# Patient Record
Sex: Male | Born: 1985 | Race: Black or African American | Hispanic: No | Marital: Single | Smoking: Former smoker
Health system: Southern US, Community
[De-identification: ages and names within clinical notes are randomized; demographics above are authoritative.]

## PROBLEM LIST (undated history)

## (undated) DIAGNOSIS — F1020 Alcohol dependence, uncomplicated: Secondary | ICD-10-CM

## (undated) DIAGNOSIS — N200 Calculus of kidney: Secondary | ICD-10-CM

## (undated) HISTORY — DX: Alcohol dependence, uncomplicated: F10.20

## (undated) HISTORY — DX: Calculus of kidney: N20.0

## (undated) HISTORY — PX: DENTAL SURGERY: SHX609

---

## 2014-12-06 ENCOUNTER — Ambulatory Visit: Payer: Self-pay | Admitting: Family

## 2014-12-09 ENCOUNTER — Ambulatory Visit (INDEPENDENT_AMBULATORY_CARE_PROVIDER_SITE_OTHER): Payer: Managed Care, Other (non HMO) | Admitting: Family

## 2014-12-09 ENCOUNTER — Encounter: Payer: Self-pay | Admitting: Family

## 2014-12-09 VITALS — BP 138/88 | HR 76 | Temp 98.4°F | Resp 18 | Ht 67.0 in | Wt 172.4 lb

## 2014-12-09 DIAGNOSIS — M545 Low back pain, unspecified: Secondary | ICD-10-CM | POA: Insufficient documentation

## 2014-12-09 NOTE — Progress Notes (Signed)
Subjective:    Patient ID: Gary Montgomery, male    DOB: [birth year], 29 y.o.   MRN: [id number]  Chief Complaint  Patient presents with  . Establish Care    having back pain, usually gives him a problem when he does something strenuous, has had his back lock up a few times, also has had issues with have shooting pains on his side, happens randomly    HPI:  Gary Founddrian Zamudio is a 29 y.o. male who presents today for an office visit to establish care.  1.) Back pain - Associated symptom of pain located in his lower back has been going on for a couple of years with episodes of his back "locking up" at periods of time. Describes the pain as soreness with timing of symptoms being worse in the morning. Severity of the pain is rated around a 5/10. Modifying factors include walking around and taking a shower which helps the pain go away. Formerly did heavy lifting at work which has since been decreased. No change to bowel or bladder habits or saddle anesthesia.  2) Shooting pains - Associated symptom of pain located in his side that is described as shooting pains that occur infrequently. The last time that he had the symptoms was several months ago. Time was the only modifying factor that made the pain better.    No Known Allergies   No current outpatient prescriptions on file prior to visit.   No current facility-administered medications on file prior to visit.    History reviewed. No pertinent past medical history.  History reviewed. No pertinent past surgical history.  Family History  Problem Relation Age of Onset  . Hypertension Mother   . Hypertension Father   . Arthritis Maternal Grandmother   . Hypertension Maternal Grandmother   . Hypertension Maternal Grandfather   . Hypertension Paternal Grandmother   . Hypertension Paternal Grandfather     History   Social History  . Marital Status: Single    Spouse Name: N/A  . Number of Children: N/A  . Years of Education: N/A    Occupational History  . Not on file.   Social History Main Topics  . Smoking status: Former Smoker -- 0.50 packs/day for 9 years    Quit date: 12/08/2009  . Smokeless tobacco: Never Used  . Alcohol Use: 12.6 oz/week    0 Standard drinks or equivalent, 21 Cans of beer per week  . Drug Use: No  . Sexual Activity: Not on file   Other Topics Concern  . Not on file   Social History Narrative    Review of Systems  Constitutional: Negative for fever and chills.  Musculoskeletal: Positive for back pain.  Neurological: Negative for numbness.      Objective:    BP 138/88 mmHg  Pulse 76  Temp(Src) 98.4 F (36.9 C) (Oral)  Resp 18  Ht 5\' 7"  (1.702 m)  Wt 172 lb 6.4 oz (78.2 kg)  BMI 27.00 kg/m2  SpO2 97% Nursing note and vital signs reviewed.  Physical Exam  Constitutional: He is oriented to person, place, and time. He appears well-developed and well-nourished. No distress.  Cardiovascular: Normal rate, regular rhythm, normal heart sounds and intact distal pulses.   Pulmonary/Chest: Effort normal and breath sounds normal.  Musculoskeletal:  No obvious deformity, discoloration, or edema of low back noted. Palpable tenderness along the left lumbar paraspinal musculature. Full range of motion is present in all directions. Distal pulses, sensation, and reflexes are intact and appropriate.  There is tight hamstring and hip flexor muscle groups.  Neurological: He is alert and oriented to person, place, and time.  Skin: Skin is warm and dry.  Psychiatric: He has a normal mood and affect. His behavior is normal. Judgment and thought content normal.       Assessment & Plan:

## 2014-12-09 NOTE — Assessment & Plan Note (Signed)
Symptoms and exam consistent with low back muscle pain secondary to muscle tightness. Continue conservative treatment with heat and stretching at this time. Provided with exercises to perform at home and stretching. Over-the-counter anti-inflammatories as needed for pain. Follow-up if symptoms worsen or fail to improve.  No evidence of shooting pains currently and exam is benign. Continue to monitor.

## 2014-12-09 NOTE — Patient Instructions (Addendum)
Thank you for choosing [facility].  Summary/Instructions:  Your prescription(s) have been submitted to your pharmacy or been printed and provided for you. Please take as directed and contact our office if you believe you are having problem(s) with the medication(s) or have any questions.  If your symptoms worsen or fail to improve, please contact our office for further instruction, or in case of emergency go directly to the emergency room at the closest medical facility.   EXERCISES  RANGE OF MOTION (ROM) AND STRETCHING EXERCISES - Low Back Strain Most people with lower back pain will find that their symptoms get worse with excessive bending forward (flexion) or arching at the lower back (extension). The exercises which will help resolve your symptoms will focus on the opposite motion.  Your physician, physical therapist or athletic trainer will help you determine which exercises will be most helpful to resolve your lower back pain. Do not complete any exercises without first consulting with your caregiver. Discontinue any exercises which make your symptoms worse until you speak to your caregiver.  If you have pain, numbness or tingling which travels down into your buttocks, leg or foot, the goal of the therapy is for these symptoms to move closer to your back and eventually resolve. Sometimes, these leg symptoms will get better, but your lower back pain may worsen. This is typically an indication of progress in your rehabilitation. Be very alert to any changes in your symptoms and the activities in which you participated in the 24 hours prior to the change. Sharing this information with your caregiver will allow him/her to most efficiently treat your condition.  These exercises may help you when beginning to rehabilitate your injury. Your symptoms may resolve with or without further involvement from your physician, physical therapist or athletic trainer. While completing these exercises,  remember:  Restoring tissue flexibility helps normal motion to return to the joints. This allows healthier, less painful movement and activity.  An effective stretch should be held for at least 30 seconds.  A stretch should never be painful. You should only feel a gentle lengthening or release in the stretched tissue. FLEXION RANGE OF MOTION AND STRETCHING EXERCISES: STRETCH - Flexion, Single Knee to Chest   Lie on a firm bed or floor with both legs extended in front of you.  Keeping one leg in contact with the floor, bring your opposite knee to your chest. Hold your leg in place by either grabbing behind your thigh or at your knee.  Pull until you feel a gentle stretch in your lower back. Hold __________ seconds.  Slowly release your grasp and repeat the exercise with the opposite side. Repeat __________ times. Complete this exercise __________ times per day.  STRETCH - Flexion, Double Knee to Chest   Lie on a firm bed or floor with both legs extended in front of you.  Keeping one leg in contact with the floor, bring your opposite knee to your chest.  Tense your stomach muscles to support your back and then lift your other knee to your chest. Hold your legs in place by either grabbing behind your thighs or at your knees.  Pull both knees toward your chest until you feel a gentle stretch in your lower back. Hold __________ seconds.  Tense your stomach muscles and slowly return one leg at a time to the floor. Repeat __________ times. Complete this exercise __________ times per day.  STRETCH - Low Trunk Rotation  Lie on a firm bed or floor.  Keeping your legs in front of you, bend your knees so they are both pointed toward the ceiling and your feet are flat on the floor.  Extend your arms out to the side. This will stabilize your upper body by keeping your shoulders in contact with the floor.  Gently and slowly drop both knees together to one side until you feel a gentle stretch in  your lower back. Hold for __________ seconds.  Tense your stomach muscles to support your lower back as you bring your knees back to the starting position. Repeat the exercise to the other side. Repeat __________ times. Complete this exercise __________ times per day  EXTENSION RANGE OF MOTION AND FLEXIBILITY EXERCISES: STRETCH - Extension, Prone on Elbows   Lie on your stomach on the floor, a bed will be too soft. Place your palms about shoulder width apart and at the height of your head.  Place your elbows under your shoulders. If this is too painful, stack pillows under your chest.  Allow your body to relax so that your hips drop lower and make contact more completely with the floor.  Hold this position for __________ seconds.  Slowly return to lying flat on the floor. Repeat __________ times. Complete this exercise __________ times per day.  RANGE OF MOTION - Extension, Prone Press Ups  Lie on your stomach on the floor, a bed will be too soft. Place your palms about shoulder width apart and at the height of your head.  Keeping your back as relaxed as possible, slowly straighten your elbows while keeping your hips on the floor. You may adjust the placement of your hands to maximize your comfort. As you gain motion, your hands will come more underneath your shoulders.  Hold this position __________ seconds.  Slowly return to lying flat on the floor. Repeat __________ times. Complete this exercise __________ times per day.  RANGE OF MOTION- Quadruped, Neutral Spine   Assume a hands and knees position on a firm surface. Keep your hands under your shoulders and your knees under your hips. You may place padding under your knees for comfort.  Drop your head and point your tail bone toward the ground below you. This will round out your lower back like an angry cat. Hold this position for __________ seconds.  Slowly lift your head and release your tail bone so that your back sags into a  large arch, like an old horse.  Hold this position for __________ seconds.  Repeat this until you feel limber in your lower back.  Now, find your "sweet spot." This will be the most comfortable position somewhere between the two previous positions. This is your neutral spine. Once you have found this position, tense your stomach muscles to support your lower back.  Hold this position for __________ seconds. Repeat __________ times. Complete this exercise __________ times per day.  STRENGTHENING EXERCISES - Low Back Strain These exercises may help you when beginning to rehabilitate your injury. These exercises should be done near your "sweet spot." This is the neutral, low-back arch, somewhere between fully rounded and fully arched, that is your least painful position. When performed in this safe range of motion, these exercises can be used for people who have either a flexion or extension based injury. These exercises may resolve your symptoms with or without further involvement from your physician, physical therapist or athletic trainer. While completing these exercises, remember:   Muscles can gain both the endurance and the strength needed for everyday  activities through controlled exercises.  Complete these exercises as instructed by your physician, physical therapist or athletic trainer. Increase the resistance and repetitions only as guided.  You may experience muscle soreness or fatigue, but the pain or discomfort you are trying to eliminate should never worsen during these exercises. If this pain does worsen, stop and make certain you are following the directions exactly. If the pain is still present after adjustments, discontinue the exercise until you can discuss the trouble with your caregiver. STRENGTHENING - Deep Abdominals, Pelvic Tilt  Lie on a firm bed or floor. Keeping your legs in front of you, bend your knees so they are both pointed toward the ceiling and your feet are flat on  the floor.  Tense your lower abdominal muscles to press your lower back into the floor. This motion will rotate your pelvis so that your tail bone is scooping upwards rather than pointing at your feet or into the floor.  With a gentle tension and even breathing, hold this position for __________ seconds. Repeat __________ times. Complete this exercise __________ times per day.  STRENGTHENING - Abdominals, Crunches   Lie on a firm bed or floor. Keeping your legs in front of you, bend your knees so they are both pointed toward the ceiling and your feet are flat on the floor. Cross your arms over your chest.  Slightly tip your chin down without bending your neck.  Tense your abdominals and slowly lift your trunk high enough to just clear your shoulder blades. Lifting higher can put excessive stress on the lower back and does not further strengthen your abdominal muscles.  Control your return to the starting position. Repeat __________ times. Complete this exercise __________ times per day.  STRENGTHENING - Quadruped, Opposite UE/LE Lift   Assume a hands and knees position on a firm surface. Keep your hands under your shoulders and your knees under your hips. You may place padding under your knees for comfort.  Find your neutral spine and gently tense your abdominal muscles so that you can maintain this position. Your shoulders and hips should form a rectangle that is parallel with the floor and is not twisted.  Keeping your trunk steady, lift your right hand no higher than your shoulder and then your left leg no higher than your hip. Make sure you are not holding your breath. Hold this position __________ seconds.  Continuing to keep your abdominal muscles tense and your back steady, slowly return to your starting position. Repeat with the opposite arm and leg. Repeat __________ times. Complete this exercise __________ times per day.  STRENGTHENING - Lower Abdominals, Double Knee Lift  Lie  on a firm bed or floor. Keeping your legs in front of you, bend your knees so they are both pointed toward the ceiling and your feet are flat on the floor.  Tense your abdominal muscles to brace your lower back and slowly lift both of your knees until they come over your hips. Be certain not to hold your breath.  Hold __________ seconds. Using your abdominal muscles, return to the starting position in a slow and controlled manner. Repeat __________ times. Complete this exercise __________ times per day.  POSTURE AND BODY MECHANICS CONSIDERATIONS - Low Back Strain Keeping correct posture when sitting, standing or completing your activities will reduce the stress put on different body tissues, allowing injured tissues a chance to heal and limiting painful experiences. The following are general guidelines for improved posture. Your physician or physical therapist  will provide you with any instructions specific to your needs. While reading these guidelines, remember:  The exercises prescribed by your provider will help you have the flexibility and strength to maintain correct postures.  The correct posture provides the best environment for your joints to work. All of your joints have less wear and tear when properly supported by a spine with good posture. This means you will experience a healthier, less painful body.  Correct posture must be practiced with all of your activities, especially prolonged sitting and standing. Correct posture is as important when doing repetitive low-stress activities (typing) as it is when doing a single heavy-load activity (lifting). RESTING POSITIONS Consider which positions are most painful for you when choosing a resting position. If you have pain with flexion-based activities (sitting, bending, stooping, squatting), choose a position that allows you to rest in a less flexed posture. You would want to avoid curling into a fetal position on your side. If your pain worsens  with extension-based activities (prolonged standing, working overhead), avoid resting in an extended position such as sleeping on your stomach. Most people will find more comfort when they rest with their spine in a more neutral position, neither too rounded nor too arched. Lying on a non-sagging bed on your side with a pillow between your knees, or on your back with a pillow under your knees will often provide some relief. Keep in mind, being in any one position for a prolonged period of time, no matter how correct your posture, can still lead to stiffness. PROPER SITTING POSTURE In order to minimize stress and discomfort on your spine, you must sit with correct posture. Sitting with good posture should be effortless for a healthy body. Returning to good posture is a gradual process. Many people can work toward this most comfortably by using various supports until they have the flexibility and strength to maintain this posture on their own. When sitting with proper posture, your ears will fall over your shoulders and your shoulders will fall over your hips. You should use the back of the chair to support your upper back. Your lower back will be in a neutral position, just slightly arched. You may place a small pillow or folded towel at the base of your lower back for support.  When working at a desk, create an environment that supports good, upright posture. Without extra support, muscles tire, which leads to excessive strain on joints and other tissues. Keep these recommendations in mind: CHAIR:  A chair should be able to slide under your desk when your back makes contact with the back of the chair. This allows you to work closely.  The chair's height should allow your eyes to be level with the upper part of your monitor and your hands to be slightly lower than your elbows. BODY POSITION  Your feet should make contact with the floor. If this is not possible, use a foot rest.  Keep your ears over your  shoulders. This will reduce stress on your neck and lower back. INCORRECT SITTING POSTURES  If you are feeling tired and unable to assume a healthy sitting posture, do not slouch or slump. This puts excessive strain on your back tissues, causing more damage and pain. Healthier options include:  Using more support, like a lumbar pillow.  Switching tasks to something that requires you to be upright or walking.  Talking a brief walk.  Lying down to rest in a neutral-spine position. PROLONGED STANDING WHILE SLIGHTLY LEANING FORWARD  When completing a task that requires you to lean forward while standing in one place for a long time, place either foot up on a stationary 2-4 inch high object to help maintain the best posture. When both feet are on the ground, the lower back tends to lose its slight inward curve. If this curve flattens (or becomes too large), then the back and your other joints will experience too much stress, tire more quickly, and can cause pain. CORRECT STANDING POSTURES Proper standing posture should be assumed with all daily activities, even if they only take a few moments, like when brushing your teeth. As in sitting, your ears should fall over your shoulders and your shoulders should fall over your hips. You should keep a slight tension in your abdominal muscles to brace your spine. Your tailbone should point down to the ground, not behind your body, resulting in an over-extended swayback posture.  INCORRECT STANDING POSTURES  Common incorrect standing postures include a forward head, locked knees and/or an excessive swayback. WALKING Walk with an upright posture. Your ears, shoulders and hips should all line-up. PROLONGED ACTIVITY IN A FLEXED POSITION When completing a task that requires you to bend forward at your waist or lean over a low surface, try to find a way to stabilize 3 out of 4 of your limbs. You can place a hand or elbow on your thigh or rest a knee on the surface  you are reaching across. This will provide you more stability so that your muscles do not fatigue as quickly. By keeping your knees relaxed, or slightly bent, you will also reduce stress across your lower back. CORRECT LIFTING TECHNIQUES DO :   Assume a wide stance. This will provide you more stability and the opportunity to get as close as possible to the object which you are lifting.  Tense your abdominals to brace your spine. Bend at the knees and hips. Keeping your back locked in a neutral-spine position, lift using your leg muscles. Lift with your legs, keeping your back straight.  Test the weight of unknown objects before attempting to lift them.  Try to keep your elbows locked down at your sides in order get the best strength from your shoulders when carrying an object.  Always ask for help when lifting heavy or awkward objects. INCORRECT LIFTING TECHNIQUES DO NOT:   Lock your knees when lifting, even if it is a small object.  Bend and twist. Pivot at your feet or move your feet when needing to change directions.  Assume that you can safely pick up even a paper clip without proper posture. Document Released: 07/15/2005 Document Revised: 10/07/2011 Document Reviewed: 10/27/2008 [facility] Patient Information 2015 [place], [place]. This information is not intended to replace advice given to you by your health care provider. Make sure you discuss any questions you have with your health care provider.  Cardiac Diet This diet can help prevent heart disease and stroke. Many factors influence your heart health, including eating and exercise habits. Coronary risk rises a lot with abnormal blood fat (lipid) levels. Cardiac meal planning includes limiting unhealthy fats, increasing healthy fats, and making other small dietary changes. General guidelines are as follows:  Adjust calorie intake to reach and maintain desirable body weight.  Limit total fat intake to less than 30% of total  calories. Saturated fat should be less than 7% of calories.  Saturated fats are found in animal products and in some vegetable products. Saturated vegetable fats are found in coconut  oil, cocoa butter, palm oil, and palm kernel oil. Read labels carefully to avoid these products as much as possible. Use butter in moderation. Choose tub margarines and oils that have 2 grams of fat or less. Good cooking oils are canola and olive oils.  Practice low-fat cooking techniques. Do not fry food. Instead, broil, bake, boil, steam, grill, roast on a rack, stir-fry, or microwave it. Other fat reducing suggestions include:  Remove the skin from poultry.  Remove all visible fat from meats.  Skim the fat off stews, soups, and gravies before serving them.  Steam vegetables in water or broth instead of sauting them in fat.  Avoid foods with trans fat (or hydrogenated oils), such as commercially fried foods and commercially baked goods. Commercial shortening and deep-frying fats will contain trans fat.  Increase intake of fruits, vegetables, whole grains, and legumes to replace foods high in fat.  Increase consumption of nuts, legumes, and seeds to at least 4 servings weekly. One serving of a legume equals  cup, and 1 serving of nuts or seeds equals  cup.  Choose whole grains more often. Have 3 servings per day (a serving is 1 ounce [oz]).  Eat 4 to 5 servings of vegetables per day. A serving of vegetables is 1 cup of raw leafy vegetables;  cup of raw or cooked cut-up vegetables;  cup of vegetable juice.  Eat 4 to 5 servings of fruit per day. A serving of fruit is 1 medium whole fruit;  cup of dried fruit;  cup of fresh, frozen, or canned fruit;  cup of 100% fruit juice.  Increase your intake of dietary fiber to 20 to 30 grams per day. Insoluble fiber may help lower your risk of heart disease and may help curb your appetite.  Soluble fiber binds cholesterol to be removed from the blood. Foods high in  soluble fiber are dried beans, citrus fruits, oats, apples, bananas, broccoli, Brussels sprouts, and eggplant.  Try to include foods fortified with plant sterols or stanols, such as yogurt, breads, juices, or margarines. Choose several fortified foods to achieve a daily intake of 2 to 3 grams of plant sterols or stanols.  Foods with omega-3 fats can help reduce your risk of heart disease. Aim to have a 3.5 oz portion of fatty fish twice per week, such as salmon, mackerel, albacore tuna, sardines, lake trout, or herring. If you wish to take a fish oil supplement, choose one that contains 1 gram of both DHA and EPA.  Limit processed meats to 2 servings (3 oz portion) weekly.  Limit the sodium in your diet to 1500 milligrams (mg) per day. If you have high blood pressure, talk to a registered dietitian about a DASH (Dietary Approaches to Stop Hypertension) eating plan.  Limit sweets and beverages with added sugar, such as soda, to no more than 5 servings per week. One serving is:   1 tablespoon sugar.  1 tablespoon jelly or jam.   cup sorbet.  1 cup lemonade.   cup regular soda. CHOOSING FOODS Starches  Allowed: Breads: All kinds (wheat, rye, raisin, white, oatmeal, [place], [place], and English muffin bread). Low-fat rolls: English muffins, frankfurter and hamburger buns, bagels, pita bread, tortillas (not fried). Pancakes, waffles, biscuits, and muffins made with recommended oil.  Avoid: Products made with saturated or trans fats, oils, or whole milk products. Butter rolls, cheese breads, croissants. Commercial doughnuts, muffins, sweet rolls, biscuits, waffles, pancakes, store-bought mixes. Crackers  Allowed: Low-fat crackers and snacks: Animal, graham,  rye, saltine (with recommended oil, no lard), oyster, and matzo crackers. Bread sticks, melba toast, rusks, flatbread, pretzels, and light popcorn.  Avoid: High-fat crackers: cheese crackers, butter crackers, and those made with  coconut, palm oil, or trans fat (hydrogenated oils). Buttered popcorn. Cereals  Allowed: Hot or cold whole-grain cereals.  Avoid: Cereals containing coconut, hydrogenated vegetable fat, or animal fat. Potatoes / Pasta / Rice  Allowed: All kinds of potatoes, rice, and pasta (such as macaroni, spaghetti, and noodles).  Avoid: Pasta or rice prepared with cream sauce or high-fat cheese. Chow mein noodles, [place] fries. Vegetables  Allowed: All vegetables and vegetable juices.  Avoid: Fried vegetables. Vegetables in cream, butter, or high-fat cheese sauces. Limit coconut. Fruit in cream or custard. Protein  Allowed: Limit your intake of meat, seafood, and poultry to no more than 6 oz (cooked weight) per day. All lean, well-trimmed beef, veal, pork, and lamb. All chicken and [place] without skin. All fish and shellfish. Wild game: wild duck, rabbit, pheasant, and venison. Egg whites or low-cholesterol egg substitutes may be used as desired. Meatless dishes: recipes with dried beans, peas, lentils, and tofu (soybean curd). Seeds and nuts: all seeds and most nuts.  Avoid: Prime grade and other heavily marbled and fatty meats, such as short ribs, spare ribs, rib eye roast or steak, frankfurters, sausage, bacon, and high-fat luncheon meats, mutton. Caviar. Commercially fried fish. Domestic duck, goose, venison sausage. Organ meats: liver, gizzard, heart, chitterlings, brains, kidney, sweetbreads. Dairy  Allowed: Low-fat cheeses: nonfat or low-fat cottage cheese (1% or 2% fat), cheeses made with part skim milk, such as mozzarella, farmers, string, or ricotta. (Cheeses should be labeled no more than 2 to 6 grams fat per oz.). Skim (or 1%) milk: liquid, powdered, or evaporated. Buttermilk made with low-fat milk. Drinks made with skim or low-fat milk or cocoa. Chocolate milk or cocoa made with skim or low-fat (1%) milk. Nonfat or low-fat yogurt.  Avoid: Whole milk cheeses, including colby, cheddar,  [place], [address], [place], [place], [place], [address], [place], and blue. Creamed cottage cheese, cream cheese. Whole milk and whole milk products, including buttermilk or yogurt made from whole milk, drinks made from whole milk. Condensed milk, evaporated whole milk, and 2% milk. Soups and Combination Foods  Allowed: Low-fat low-sodium soups: broth, dehydrated soups, homemade broth, soups with the fat removed, homemade cream soups made with skim or low-fat milk. Low-fat spaghetti, lasagna, chili, and Spanish rice if low-fat ingredients and low-fat cooking techniques are used.  Avoid: Cream soups made with whole milk, cream, or high-fat cheese. All other soups. Desserts and Sweets  Allowed: Sherbet, fruit ices, gelatins, meringues, and angel food cake. Homemade desserts with recommended fats, oils, and milk products. Jam, jelly, honey, marmalade, sugars, and syrups. Pure sugar candy, such as gum drops, hard candy, jelly beans, marshmallows, mints, and small amounts of dark chocolate.  Avoid: Commercially prepared cakes, pies, cookies, frosting, pudding, or mixes for these products. Desserts containing whole milk products, chocolate, coconut, lard, palm oil, or palm kernel oil. Ice cream or ice cream drinks. Candy that contains chocolate, coconut, butter, hydrogenated fat, or unknown ingredients. Buttered syrups. Fats and Oils  Allowed: Vegetable oils: safflower, sunflower, corn, soybean, cottonseed, sesame, canola, olive, or peanut. Non-hydrogenated margarines. Salad dressing or mayonnaise: homemade or commercial, made with a recommended oil. Low or nonfat salad dressing or mayonnaise.  Limit added fats and oils to 6 to 8 tsp per day (includes fats used in cooking, baking, salads, and spreads on bread). Remember  to count the "hidden fats" in foods.  Avoid: Solid fats and shortenings: butter, lard, salt pork, bacon drippings. Gravy containing meat fat, shortening, or suet. Cocoa butter, coconut.  Coconut oil, palm oil, palm kernel oil, or hydrogenated oils: these ingredients are often used in bakery products, nondairy creamers, whipped toppings, candy, and commercially fried foods. Read labels carefully. Salad dressings made of unknown oils, sour cream, or cheese, such as blue cheese and Roquefort. Cream, all kinds: half-and-half, light, heavy, or whipping. Sour cream or cream cheese (even if "light" or low-fat). Nondairy cream substitutes: coffee creamers and sour cream substitutes made with palm, palm kernel, hydrogenated oils, or coconut oil. Beverages  Allowed: Coffee (regular or decaffeinated), tea. Diet carbonated beverages, mineral water. Alcohol: Check with your caregiver. Moderation is recommended.  Avoid: Whole milk, regular sodas, and juice drinks with added sugar. Condiments  Allowed: All seasonings and condiments. Cocoa powder. "Cream" sauces made with recommended ingredients.  Avoid: Carob powder made with hydrogenated fats. SAMPLE MENU Breakfast   cup orange juice   cup oatmeal  1 slice toast  1 tsp margarine  1 cup skim milk Lunch  [place] sandwich with 2 oz [place], 2 slices bread  Lettuce and tomato slices  Fresh fruit  Carrot sticks  Coffee or tea Snack  Fresh fruit or low-fat crackers Dinner  3 oz lean ground beef  1 baked potato  1 tsp margarine   cup asparagus  Lettuce salad  1 tbs non-creamy dressing   cup peach slices  1 cup skim milk Document Released: 04/23/2008 Document Revised: 01/14/2012 Document Reviewed: 09/14/2013 ExitCare Patient Information 2015 [place], [place]. This information is not intended to replace advice given to you by your health care provider. Make sure you discuss any questions you have with your health care provider.

## 2014-12-09 NOTE — Progress Notes (Signed)
Pre visit review using our clinic review tool, if applicable. No additional management support is needed unless otherwise documented below in the visit note. 

## 2014-12-13 ENCOUNTER — Ambulatory Visit: Payer: Self-pay | Admitting: Primary Care

## 2014-12-15 ENCOUNTER — Telehealth: Payer: Self-pay | Admitting: Family

## 2014-12-15 ENCOUNTER — Ambulatory Visit (INDEPENDENT_AMBULATORY_CARE_PROVIDER_SITE_OTHER): Payer: Managed Care, Other (non HMO) | Admitting: Family

## 2014-12-15 ENCOUNTER — Other Ambulatory Visit (INDEPENDENT_AMBULATORY_CARE_PROVIDER_SITE_OTHER): Payer: Managed Care, Other (non HMO)

## 2014-12-15 ENCOUNTER — Encounter: Payer: Self-pay | Admitting: Family

## 2014-12-15 VITALS — BP 142/102 | HR 77 | Temp 98.6°F | Resp 18 | Ht 67.0 in | Wt 170.4 lb

## 2014-12-15 DIAGNOSIS — Z0000 Encounter for general adult medical examination without abnormal findings: Secondary | ICD-10-CM | POA: Diagnosis not present

## 2014-12-15 DIAGNOSIS — Z23 Encounter for immunization: Secondary | ICD-10-CM | POA: Diagnosis not present

## 2014-12-15 LAB — LIPID PANEL
Cholesterol: 183 mg/dL (ref 0–200)
HDL: 52 mg/dL (ref >39.00)
LDL Cholesterol: 123 mg/dL — ABNORMAL HIGH (ref 0–99)
NonHDL: 131
Total CHOL/HDL Ratio: 4
Triglycerides: 40 mg/dL (ref 0.0–149.0)
VLDL: 8 mg/dL (ref 0.0–40.0)

## 2014-12-15 LAB — CBC
HCT: 45.3 % (ref 39.0–52.0)
Hemoglobin: 15.4 g/dL (ref 13.0–17.0)
MCHC: 33.9 g/dL (ref 30.0–36.0)
MCV: 87.2 fl (ref 78.0–100.0)
Platelets: 237 10*3/uL (ref 150.0–400.0)
RBC: 5.2 Mil/uL (ref 4.22–5.81)
RDW: 13.8 % (ref 11.5–15.5)
WBC: 3.9 10*3/uL — ABNORMAL LOW (ref 4.0–10.5)

## 2014-12-15 LAB — TSH: TSH: 0.87 u[IU]/mL (ref 0.35–4.50)

## 2014-12-15 LAB — BASIC METABOLIC PANEL
BUN: 16 mg/dL (ref 6–23)
CO2: 29 mEq/L (ref 19–32)
Calcium: 10.4 mg/dL (ref 8.4–10.5)
Chloride: 106 mEq/L (ref 96–112)
Creatinine, Ser: 1.2 mg/dL (ref 0.40–1.50)
GFR: 91.58 mL/min (ref >60.00)
Glucose, Bld: 95 mg/dL (ref 70–99)
Potassium: 5.2 mEq/L — ABNORMAL HIGH (ref 3.5–5.1)
Sodium: 138 mEq/L (ref 135–145)

## 2014-12-15 NOTE — Telephone Encounter (Signed)
Please informed patient that his lab work shows that his kidney function, thyroid, white and red blood cells, and cholesterol are all within normal limits. His potassium was slightly elevated today, however no intervention is needed at this time. His LDL cholesterol was 143 which is slightly high but nothing that requires intervention at this time. To decrease cholesterol, increase fruit, vegetables, and fiber intake while decreasing saturated fat intake. Planned follow-up in 6 months to recheck blood work.

## 2014-12-15 NOTE — Patient Instructions (Signed)
Thank you for choosing [facility].  Summary/Instructions:  Please stop by the lab on the basement level of the building for your blood work. Your results will be released to MyChart (or called to you) after review, usually within 72 hours after test completion. If any changes need to be made, you will be notified at that same time.  If your symptoms worsen or fail to improve, please contact our office for further instruction, or in case of emergency go directly to the emergency room at the closest medical facility.   Health Maintenance A healthy lifestyle and preventative care can promote health and wellness.  Maintain regular health, dental, and eye exams.  Eat a healthy diet. Foods like vegetables, fruits, whole grains, low-fat dairy products, and lean protein foods contain the nutrients you need and are low in calories. Decrease your intake of foods high in solid fats, added sugars, and salt. Get information about a proper diet from your health care provider, if necessary.  Regular physical exercise is one of the most important things you can do for your health. Most adults should get at least 150 minutes of moderate-intensity exercise (any activity that increases your heart rate and causes you to sweat) each week. In addition, most adults need muscle-strengthening exercises on 2 or more days a week.   Maintain a healthy weight. The body mass index (BMI) is a screening tool to identify possible weight problems. It provides an estimate of body fat based on height and weight. Your health care provider can find your BMI and can help you achieve or maintain a healthy weight. For males 20 years and older:  A BMI below 18.5 is considered underweight.  A BMI of 18.5 to 24.9 is normal.  A BMI of 25 to 29.9 is considered overweight.  A BMI of 30 and above is considered obese.  Maintain normal blood lipids and cholesterol by exercising and minimizing your intake of saturated fat. Eat a  balanced diet with plenty of fruits and vegetables. Blood tests for lipids and cholesterol should begin at age 20 and be repeated every 5 years. If your lipid or cholesterol levels are high, you are over age 50, or you are at high risk for heart disease, you may need your cholesterol levels checked more frequently.Ongoing high lipid and cholesterol levels should be treated with medicines if diet and exercise are not working.  If you smoke, find out from your health care provider how to quit. If you do not use tobacco, do not start.  Lung cancer screening is recommended for adults aged 55-80 years who are at high risk for developing lung cancer because of a history of smoking. A yearly low-dose CT scan of the lungs is recommended for people who have at least a 30-pack-year history of smoking and are current smokers or have quit within the past 15 years. A pack year of smoking is smoking an average of 1 pack of cigarettes a day for 1 year (for example, a 30-pack-year history of smoking could mean smoking 1 pack a day for 30 years or 2 packs a day for 15 years). Yearly screening should continue until the smoker has stopped smoking for at least 15 years. Yearly screening should be stopped for people who develop a health problem that would prevent them from having lung cancer treatment.  If you choose to drink alcohol, do not have more than 2 drinks per day. One drink is considered to be 12 oz (360 mL) of beer,   5 oz (150 mL) of wine, or 1.5 oz (45 mL) of liquor.  Avoid the use of street drugs. Do not share needles with anyone. Ask for help if you need support or instructions about stopping the use of drugs.  High blood pressure causes heart disease and increases the risk of stroke. Blood pressure should be checked at least every 1-2 years. Ongoing high blood pressure should be treated with medicines if weight loss and exercise are not effective.  If you are 45-79 years old, ask your health care provider if  you should take aspirin to prevent heart disease.  Diabetes screening involves taking a blood sample to check your fasting blood sugar level. This should be done once every 3 years after age 45 if you are at a normal weight and without risk factors for diabetes. Testing should be considered at a younger age or be carried out more frequently if you are overweight and have at least 1 risk factor for diabetes.  Colorectal cancer can be detected and often prevented. Most routine colorectal cancer screening begins at the age of 50 and continues through age 75. However, your health care provider may recommend screening at an earlier age if you have risk factors for colon cancer. On a yearly basis, your health care provider may provide home test kits to check for hidden blood in the stool. A small camera at the end of a tube may be used to directly examine the colon (sigmoidoscopy or colonoscopy) to detect the earliest forms of colorectal cancer. Talk to your health care provider about this at age 50 when routine screening begins. A direct exam of the colon should be repeated every 5-10 years through age 75, unless early forms of precancerous polyps or small growths are found.  People who are at an increased risk for hepatitis B should be screened for this virus. You are considered at high risk for hepatitis B if:  You were born in a country where hepatitis B occurs often. Talk with your health care provider about which countries are considered high risk.  Your parents were born in a high-risk country and you have not received a shot to protect against hepatitis B (hepatitis B vaccine).  You have HIV or AIDS.  You use needles to inject street drugs.  You live with, or have sex with, someone who has hepatitis B.  You are a man who has sex with other men (MSM).  You get hemodialysis treatment.  You take certain medicines for conditions like cancer, organ transplantation, and autoimmune  conditions.  Hepatitis C blood testing is recommended for all people born from 1945 through 1965 and any individual with known risk factors for hepatitis C.  Healthy men should no longer receive prostate-specific antigen (PSA) blood tests as part of routine cancer screening. Talk to your health care provider about prostate cancer screening.  Testicular cancer screening is not recommended for adolescents or adult males who have no symptoms. Screening includes self-exam, a health care provider exam, and other screening tests. Consult with your health care provider about any symptoms you have or any concerns you have about testicular cancer.  Practice safe sex. Use condoms and avoid high-risk sexual practices to reduce the spread of sexually transmitted infections (STIs).  You should be screened for STIs, including gonorrhea and chlamydia if:  You are sexually active and are younger than 24 years.  You are older than 24 years, and your health care provider tells you that you are at   risk for this type of infection.  Your sexual activity has changed since you were last screened, and you are at an increased risk for chlamydia or gonorrhea. Ask your health care provider if you are at risk.  If you are at risk of being infected with HIV, it is recommended that you take a prescription medicine daily to prevent HIV infection. This is called pre-exposure prophylaxis (PrEP). You are considered at risk if:  You are a man who has sex with other men (MSM).  You are a heterosexual man who is sexually active with multiple partners.  You take drugs by injection.  You are sexually active with a partner who has HIV.  Talk with your health care provider about whether you are at high risk of being infected with HIV. If you choose to begin PrEP, you should first be tested for HIV. You should then be tested every 3 months for as long as you are taking PrEP.  Use sunscreen. Apply sunscreen liberally and  repeatedly throughout the day. You should seek shade when your shadow is shorter than you. Protect yourself by wearing long sleeves, pants, a wide-brimmed hat, and sunglasses year round whenever you are outdoors.  Tell your health care provider of new moles or changes in moles, especially if there is a change in shape or color. Also, tell your health care provider if a mole is larger than the size of a pencil eraser.  A one-time screening for abdominal aortic aneurysm (AAA) and surgical repair of large AAAs by ultrasound is recommended for men aged 65-75 years who are current or former smokers.  Stay current with your vaccines (immunizations). Document Released: 01/11/2008 Document Revised: 07/20/2013 Document Reviewed: 12/10/2010 ExitCare Patient Information 2015 [employer]. This information is not intended to replace advice given to you by your health care provider. Make sure you discuss any questions you have with your health care provider.  

## 2014-12-15 NOTE — Progress Notes (Signed)
Pre visit review using our clinic review tool, if applicable. No additional management support is needed unless otherwise documented below in the visit note. 

## 2014-12-15 NOTE — Progress Notes (Signed)
Subjective:    Patient ID: Gary Montgomery, male    DOB: [birth year], 29 y.o.   MRN: [id number]  Chief Complaint  Patient presents with  . CPE    no fasting    HPI:  Gary Montgomery is a 29 y.o. male who presents today for an annual wellness visit.   1) Health Maintenance -   Diet - Averaging 2 meals per day consisting of chicken, vegetables, legumes, rice; Denies caffeine intake   Exercise - At work - no structured exercise outside of work   2) Corporate investment bankerreventative Exams / Immunizations:  Dental -- Due for exam  Vision -- Due for exam   Health Maintenance  Topic Date Due  . HIV Screening  03/17/2000  . TETANUS/TDAP  03/17/2004  . INFLUENZA VACCINE  02/27/2015  Tetanus shot   There is no immunization history on file for this patient.  No Known Allergies   Outpatient Prescriptions Prior to Visit  Medication Sig Dispense Refill  . cholecalciferol (VITAMIN D) 1000 UNITS tablet Take 1,000 Units by mouth daily.     No facility-administered medications prior to visit.    No past medical history on file.   No past surgical history on file.   Family History  Problem Relation Age of Onset  . Hypertension Mother   . Hypertension Father   . Arthritis Maternal Grandmother   . Hypertension Maternal Grandmother   . Hypertension Maternal Grandfather   . Hypertension Paternal Grandmother   . Hypertension Paternal Grandfather     History   Social History  . Marital Status: Single    Spouse Name: N/A  . Number of Children: 0  . Years of Education: 14   Occupational History  . Apartment Maintenance    Social History Main Topics  . Smoking status: Former Smoker -- 0.50 packs/day for 9 years    Quit date: 12/08/2009  . Smokeless tobacco: Never Used  . Alcohol Use: 12.6 oz/week    0 Standard drinks or equivalent, 21 Cans of beer per week  . Drug Use: No  . Sexual Activity: Not on file   Other Topics Concern  . Not on file   Social History Narrative   Fun: Eat,  hang out with friends, travel   Denies religious beliefs effecting health care.     Review of Systems  Constitutional: Denies fever, chills, fatigue, or significant weight gain/loss. HENT: Head: Denies headache or neck pain Ears: Denies changes in hearing, ringing in ears, earache, drainage Nose: Denies discharge, stuffiness, itching, nosebleed, sinus pain Throat: Denies sore throat, hoarseness, dry mouth, sores, thrush Eyes: Denies loss/changes in vision, pain, redness, blurry/double vision, flashing lights Cardiovascular: Denies chest pain/discomfort, tightness, palpitations, shortness of breath with activity, difficulty lying down, swelling, sudden awakening with shortness of breath Respiratory: Denies shortness of breath, cough, sputum production, wheezing Gastrointestinal: Denies dysphasia, heartburn, change in appetite, nausea, change in bowel habits, rectal bleeding, constipation, diarrhea, yellow skin or eyes Genitourinary: Denies frequency, urgency, burning/pain, blood in urine, incontinence, change in urinary strength. Musculoskeletal: Denies muscle/joint pain, stiffness, back pain, redness or swelling of joints, trauma Skin: Denies rashes, lumps, itching, dryness, color changes, or hair/nail changes Neurological: Denies dizziness, fainting, seizures, weakness, numbness, tingling, tremor Psychiatric - Denies nervousness, stress, depression or memory loss Endocrine: Denies heat or cold intolerance, sweating, frequent urination, excessive thirst, changes in appetite Hematologic: Denies ease of bruising or bleeding     Objective:    BP 142/102 mmHg  Pulse 77  Temp(Src) 98.6 F (  37 C) (Oral)  Resp 18  Ht 5\' 7"  (1.702 m)  Wt 170 lb 6.4 oz (77.293 kg)  BMI 26.68 kg/m2  SpO2 97% Nursing note and vital signs reviewed.  Physical Exam  Constitutional: He is oriented to person, place, and time. He appears well-developed and well-nourished.  HENT:  Head: Normocephalic.  Right  Ear: Hearing, tympanic membrane, external ear and ear canal normal.  Left Ear: Hearing, tympanic membrane, external ear and ear canal normal.  Nose: Nose normal.  Mouth/Throat: Uvula is midline, oropharynx is clear and moist and mucous membranes are normal.  Eyes: Conjunctivae and EOM are normal. Pupils are equal, round, and reactive to light.  Neck: Neck supple. No JVD present. No tracheal deviation present. No thyromegaly present.  Cardiovascular: Normal rate, regular rhythm, normal heart sounds and intact distal pulses.   Pulmonary/Chest: Effort normal and breath sounds normal.  Abdominal: Soft. Bowel sounds are normal. He exhibits no distension and no mass. There is no tenderness. There is no rebound and no guarding.  Musculoskeletal: Normal range of motion. He exhibits no edema or tenderness.  Lymphadenopathy:    He has no cervical adenopathy.  Neurological: He is alert and oriented to person, place, and time. He has normal reflexes. No cranial nerve deficit. He exhibits normal muscle tone. Coordination normal.  Skin: Skin is warm and dry.  Psychiatric: He has a normal mood and affect. His behavior is normal. Judgment and thought content normal.       Assessment & Plan:

## 2014-12-15 NOTE — Assessment & Plan Note (Addendum)
g1) Anticipatory Guidance: Discussed importance of wearing a seatbelt while driving and not texting while driving; changing batteries in smoke detector at least once annually; wearing suntan lotion when outside; eating a balanced and moderate diet; getting physical activity at least 30 minutes per day.  2) Immunizations / Screenings / Labs:  Tetanus shot updated today. All other immunizations are up-to-date per recommendations. Patient is due for a dental and vision screen which he will schedule independently. All other screenings are up-to-date per recommendations. Obtain CBC, BMET, Lipid profile and TSH.   Overall well exam. Patient has minimal cardiovascular risk factors at this time. Blood pressure was elevated today. Continue to monitor. Continue healthy lifestyle choices and behaviors. Discussed importance of a nutrient dense diet and physical activity. Follow-up prevention exam in 1 year. Follow-up office visit pending lab work.

## 2014-12-19 NOTE — Telephone Encounter (Signed)
Pt aware of lab results 

## 2014-12-20 ENCOUNTER — Encounter (HOSPITAL_COMMUNITY): Payer: Self-pay | Admitting: Physical Medicine and Rehabilitation

## 2014-12-20 ENCOUNTER — Emergency Department (HOSPITAL_COMMUNITY)
Admission: EM | Admit: 2014-12-20 | Discharge: 2014-12-20 | Disposition: A | Discharge status: Home / Self Care | Payer: Managed Care, Other (non HMO) | Attending: Emergency Medicine | Admitting: Emergency Medicine

## 2014-12-20 DIAGNOSIS — Y999 Unspecified external cause status: Secondary | ICD-10-CM | POA: Diagnosis not present

## 2014-12-20 DIAGNOSIS — T148 Other injury of unspecified body region: Secondary | ICD-10-CM | POA: Diagnosis present

## 2014-12-20 DIAGNOSIS — Y9389 Activity, other specified: Secondary | ICD-10-CM | POA: Insufficient documentation

## 2014-12-20 DIAGNOSIS — T63301A Toxic effect of unspecified spider venom, accidental (unintentional), initial encounter: Secondary | ICD-10-CM

## 2014-12-20 DIAGNOSIS — Y929 Unspecified place or not applicable: Secondary | ICD-10-CM | POA: Insufficient documentation

## 2014-12-20 DIAGNOSIS — W57XXXA Bitten or stung by nonvenomous insect and other nonvenomous arthropods, initial encounter: Secondary | ICD-10-CM | POA: Diagnosis not present

## 2014-12-20 DIAGNOSIS — Z79899 Other long term (current) drug therapy: Secondary | ICD-10-CM | POA: Insufficient documentation

## 2014-12-20 DIAGNOSIS — Z87891 Personal history of nicotine dependence: Secondary | ICD-10-CM | POA: Insufficient documentation

## 2014-12-20 NOTE — ED Notes (Signed)
Pt in stating he saw a spider on his right hand and is concerned it bit him. No bite ever felt by patient, no redness or swelling ever noted.

## 2014-12-20 NOTE — ED Notes (Signed)
Pt presents to department for evaluation of possible spider bite to R hand. States he was installing windows in house when he felt "pinching" sensation and then noticed spider crawling on wall. No swelling noted to R hand upon arrival. Respirations unlabored. NAD

## 2014-12-20 NOTE — Discharge Instructions (Signed)
Spider Bite Spider bites are not common. Most spider bites do not cause serious problems. The elderly, very young children, and people with certain existing medical conditions are more likely to experience significant symptoms. SYMPTOMS  Spider bites may not cause any pain at first. Within 1 or 2 days of the bite, there may be swelling, redness, and pain in the bite area. However, some spider bites can cause pain within the first hour. TREATMENT  Your caregiver may prescribe antibiotic medicine if a bacterial infection develops in the bite. However, not all spider bites require antibiotics or prescription medicines.  HOME CARE INSTRUCTIONS  Do not scratch the bite area.  Keep the bite area clean and dry. Wash the area with soap and water as directed.  Put ice or cool compresses on the bite area.  Put ice in a plastic bag.  Place a towel between your skin and the bag.  Leave the ice on for 20 minutes, 4 times a day for the first 2 to 3 days, or as directed.  Keep the bite area elevated above the level of your heart. This helps reduce redness and swelling.  Only take over-the-counter or prescription medicines as directed by your caregiver.  If you are given antibiotics, take them as directed. Finish them even if you start to feel better. You may need a tetanus shot if:  You cannot remember when you had your last tetanus shot.  You have never had a tetanus shot.  The injury broke your skin. If you get a tetanus shot, your arm may swell, get red, and feel warm to the touch. This is common and not a problem. If you need a tetanus shot and you choose not to have one, there is a rare chance of getting tetanus. Sickness from tetanus can be serious. SEEK MEDICAL CARE IF: Your bite is not better after 3 days of treatment. SEEK IMMEDIATE MEDICAL CARE IF:  Your bite turns purple or develops increased swelling, pain, or redness.  You develop shortness of breath or chest pain.  You have  muscle cramps or painful muscle spasms.  You develop abdominal pain, nausea, or vomiting.  You feel unusually tired or sleepy. MAKE SURE YOU:  Understand these instructions.  Will watch your condition.  Will get help right away if you are not doing well or get worse. Document Released: 08/22/2004 Document Revised: 10/07/2011 Document Reviewed: 02/13/2011 Exit[employer] Patient Information 2015 [employer]. This information is not intended to replace advice given to you by your health care provider. Make sure you discuss any questions you have with your health care provider.  

## 2014-12-20 NOTE — ED Provider Notes (Signed)
CSN: [id number]     Arrival date & time 12/20/14  1539 History  This chart was scribed for Marlon Pel, PA, working with Mancel Bale, MD by Lyndel Safe and Gwenyth Ober, ED Scribe. This paitent was seen in room TR05C/TR05C and the patient's care was started at 4:42 PM.   Chief Complaint  Patient presents with  . Insect Bite   The history is provided by the patient. No language interpreter was used.    HPI Comments: Gary Montgomery is a 29 y.o. male who presents to the Emergency Department complaining of a suspected insect bite that occurred approximately 2 hours ago. Pt states he was working outside when he felt a pinch and saw a small, black, oval-backed spider on his right hand. Denies that it was a black widow or brown recluse. Pt has PMhx of elevated BP but is not currently taking BP medication. He also notes high salt consumption. Pt denies pain, redness, swelling, an obvious bite mark and numbness to right hand or fingers.  History reviewed. No pertinent past medical history. History reviewed. No pertinent past surgical history. Family History  Problem Relation Age of Onset  . Hypertension Mother   . Hypertension Father   . Arthritis Maternal Grandmother   . Hypertension Maternal Grandmother   . Hypertension Maternal Grandfather   . Hypertension Paternal Grandmother   . Hypertension Paternal Grandfather    History  Substance Use Topics  . Smoking status: Former Smoker -- 0.50 packs/day for 9 years    Quit date: 12/08/2009  . Smokeless tobacco: Never Used  . Alcohol Use: 12.6 oz/week    0 Standard drinks or equivalent, 21 Cans of beer per week    Review of Systems  Skin: Negative for color change, rash and wound.   A complete 10 system review of systems was obtained and is otherwise negative except at noted in the HPI and PMH.  Allergies  Review of patient's allergies indicates no known allergies.  Home Medications   Prior to Admission medications    Medication Sig Start Date End Date Taking? Authorizing Provider  cholecalciferol (VITAMIN D) 1000 UNITS tablet Take 1,000 Units by mouth daily.    Historical Provider, MD   BP 145/98 mmHg  Pulse 63  Temp(Src) 98.7 F (37.1 C) (Oral)  Resp 18  SpO2 99%  Physical Exam  Constitutional: He appears well-developed and well-nourished.  HENT:  Head: Normocephalic and atraumatic.  Eyes: Conjunctivae are normal. Right eye exhibits no discharge. Left eye exhibits no discharge.  Pulmonary/Chest: Effort normal. No respiratory distress.  Musculoskeletal:  Physical exam of the right hand. No insect bite noted. No redness, induration, erythema, swelling. No decreased range of motion of all 5 fingers. CR to all five fingers. No tenderness to palpation of fingers, hand or forearm. FROM with pain or any abnormality.  Neurological: He is alert. Coordination normal.  Skin: Skin is warm and dry. No rash noted. He is not diaphoretic. No erythema.  Psychiatric: He has a normal mood and affect.  Nursing note and vitals reviewed.   ED Course  Procedures  DIAGNOSTIC STUDIES: Oxygen Saturation is 99% on RA, normal by my interpretation.    COORDINATION OF CARE: 4:47 PM Discussed to order and prescribe antibiotic ointment. Explained that the biggest risk would be developing an abscess but that the abx ointment should help prevent that. Pt agrees with course of treatment.   Labs Review Labs Reviewed - No data to display  Imaging Review No results found.  EKG Interpretation None     MDM   Final diagnoses:  Spider bite, accidental or unintentional, initial encounter   29 y.o.Gary Montgomery's evaluation in the Emergency Department is complete. It has been determined that no acute conditions requiring further emergency intervention are present at this time. The patient/guardian have been advised of the diagnosis and plan. We have discussed signs and symptoms that warrant return to the ED, such as  changes or worsening in symptoms.  Vital signs are stable at discharge. Filed Vitals:   12/20/14 1558  BP: 145/98  Pulse: 63  Temp: 98.7 F (37.1 C)  Resp: 18    Patient/guardian has voiced understanding and agreed to follow-up with the PCP or specialist.  I personally performed the services described in this documentation, which was scribed in my presence. The recorded information has been reviewed and is accurate.   Marlon Peliffany Teyla Skidgel, PA-C 12/20/14 1722  Mancel BaleElliott Wentz, MD 12/21/14 [phone]

## 2015-01-10 ENCOUNTER — Ambulatory Visit (INDEPENDENT_AMBULATORY_CARE_PROVIDER_SITE_OTHER): Payer: Managed Care, Other (non HMO) | Admitting: Family

## 2015-01-10 ENCOUNTER — Encounter: Payer: Self-pay | Admitting: Family

## 2015-01-10 VITALS — BP 130/94 | HR 65 | Temp 98.4°F | Resp 18 | Ht 67.0 in | Wt 167.1 lb

## 2015-01-10 DIAGNOSIS — R140 Abdominal distension (gaseous): Secondary | ICD-10-CM | POA: Diagnosis not present

## 2015-01-10 NOTE — Progress Notes (Signed)
Pre visit review using our clinic review tool, if applicable. No additional management support is needed unless otherwise documented below in the visit note. 

## 2015-01-10 NOTE — Assessment & Plan Note (Signed)
Bloating most likely related to indigestion or constipation. Start over-the-counter probiotics and stool softeners as needed. Increase fluid and fiber intake gradually. Follow-up if symptoms worsen or fail to improve.

## 2015-01-10 NOTE — Patient Instructions (Signed)
Thank you for choosing [facility].  Summary/Instructions:  Please start taking Align daily for a probiotic. Also start taking a stool softner to help with constipation.   Your prescription(s) have been submitted to your pharmacy or been printed and provided for you. Please take as directed and contact our office if you believe you are having problem(s) with the medication(s) or have any questions.  If your symptoms worsen or fail to improve, please contact our office for further instruction, or in case of emergency go directly to the emergency room at the closest medical facility.    Bloating Bloating is the feeling of fullness in your belly. You may feel as though your pants are too tight. Often the cause of bloating is overeating, retaining fluids, or having gas in your bowel. It is also caused by swallowing air and eating foods that cause gas. Irritable bowel syndrome is one of the most common causes of bloating. Constipation is also a common cause. Sometimes more serious problems can cause bloating. SYMPTOMS  Usually there is a feeling of fullness, as though your abdomen is bulged out. There may be mild discomfort.  DIAGNOSIS  Usually no particular testing is necessary for most bloating. If the condition persists and seems to become worse, your caregiver may do additional testing.  TREATMENT   There is no direct treatment for bloating.  Do not put gas into the bowel. Avoid chewing gum and sucking on candy. These tend to make you swallow air. Swallowing air can also be a nervous habit. Try to avoid this.  Avoiding high residue diets will help. Eat foods with soluble fibers (examples include root vegetables, apples, or barley) and substitute dairy products with soy and rice products. This helps irritable bowel syndrome.  If constipation is the cause, then a high residue diet with more fiber will help.  Avoid carbonated beverages.  Over-the-counter preparations are available  that help reduce gas. Your pharmacist can help you with this. SEEK MEDICAL CARE IF:   Bloating continues and seems to be getting worse.  You notice a weight gain.  You have a weight loss but the bloating is getting worse.  You have changes in your bowel habits or develop nausea or vomiting. SEEK IMMEDIATE MEDICAL CARE IF:   You develop shortness of breath or swelling in your legs.  You have an increase in abdominal pain or develop chest pain. Document Released: 05/15/2006 Document Revised: 10/07/2011 Document Reviewed: 07/03/2007 [facility] Patient Information 2015 [place], [place]. This information is not intended to replace advice given to you by your health care provider. Make sure you discuss any questions you have with your health care provider.

## 2015-01-10 NOTE — Progress Notes (Signed)
   Subjective:    Patient ID: Gary Montgomery, male    DOB: [birth year], 29 y.o.   MRN: [id number]  Chief Complaint  Patient presents with  . Bloated    No appetite and bloated feeling, wants to make sure its not a peptic ulcer, x2 weeks    HPI:  Gary Montgomery is a 29 y.o. male with a PMH of low back pain who presents today for an acute visit.   This is a new problem. Associated symptom of bloating and decreased appetite has been going on for a couple of weeks. Modifying factors include magnesium citrate to relieve constipation which did not help very much. Has also tried magnesium and cranberry juice. Denies abdominal pain. Frequency of the bowel movements is currently daily.    Wt Readings from Last 3 Encounters:  01/10/15 167 lb 1.9 oz (75.805 kg)  12/15/14 170 lb 6.4 oz (77.293 kg)  12/09/14 172 lb 6.4 oz (78.2 kg)    No Known Allergies  Current Outpatient Prescriptions on File Prior to Visit  Medication Sig Dispense Refill  . cholecalciferol (VITAMIN D) 1000 UNITS tablet Take 1,000 Units by mouth daily.     No current facility-administered medications on file prior to visit.    Review of Systems  Constitutional: Negative for fever and chills.  Gastrointestinal: Positive for abdominal distention. Negative for nausea, vomiting, abdominal pain, diarrhea and constipation.      Objective:    BP 130/94 mmHg  Pulse 65  Temp(Src) 98.4 F (36.9 C) (Oral)  Resp 18  Ht 5\' 7"  (1.702 m)  Wt 167 lb 1.9 oz (75.805 kg)  BMI 26.17 kg/m2  SpO2 98% Nursing note and vital signs reviewed.  Physical Exam  Constitutional: He is oriented to person, place, and time. He appears well-developed and well-nourished. No distress.  Cardiovascular: Normal rate, regular rhythm, normal heart sounds and intact distal pulses.   Pulmonary/Chest: Effort normal and breath sounds normal.  Abdominal: Soft. Normal appearance and bowel sounds are normal. He exhibits no mass. There is no  hepatosplenomegaly. There is no tenderness. There is no rigidity, no rebound, no guarding, no CVA tenderness, no tenderness at McBurney's point and negative Murphy's sign.  Neurological: He is alert and oriented to person, place, and time.  Skin: Skin is warm and dry.  Psychiatric: He has a normal mood and affect. His behavior is normal. Judgment and thought content normal.       Assessment & Plan:   Problem List Items Addressed This Visit      Other   Bloating symptom - Primary    Bloating most likely related to indigestion or constipation. Start over-the-counter probiotics and stool softeners as needed. Increase fluid and fiber intake gradually. Follow-up if symptoms worsen or fail to improve.

## 2015-07-10 ENCOUNTER — Telehealth: Payer: Self-pay | Admitting: Family

## 2015-07-10 NOTE — Telephone Encounter (Signed)
Do you want pt to come in?

## 2015-07-10 NOTE — Telephone Encounter (Signed)
[facility]  Patient Name: Gary Montgomery  DOB: Jul 27, 1986    Initial Comment caller states he has constipation   Nurse Assessment  Nurse: Elwyn LadeBurress, RN, Misty StanleyLisa Date/Time (Eastern Time): 07/10/2015 2:48:27 PM  Confirm and document reason for call. If symptomatic, describe symptoms. ---caller states is constipated, feels not evacuating completely; has had 2 bm's today and 2 yesterday and still feels has more; thinks may be backed up; states abuses alcohol once every weekend and denies need for help with stopping  Has the patient traveled out of the country within the last 30 days? ---Not Applicable  Does the patient have any new or worsening symptoms? ---Yes  Will a triage be completed? ---Yes  Related visit to physician within the last 2 weeks? ---No  Does the PT have any chronic conditions? (i.e. diabetes, asthma, etc.) ---No  Is this a behavioral health or substance abuse call? ---No     Guidelines    Guideline Title Affirmed Question Affirmed Notes  Constipation Abdomen is more swollen than usual    Final Disposition User   See Physician within 24 Hours Burress, RN, Misty StanleyLisa    Comments  while attempting to make appt, call disconnected; called back and VM not set up   Referrals  REFERRED TO PCP OFFICE   Disagree/Comply: Comply

## 2015-07-10 NOTE — Telephone Encounter (Signed)
May need x-ray to rule out partial bowel obstruction. Please have him schedule an appointment.

## 2015-07-12 NOTE — Telephone Encounter (Signed)
Tried to contact patient.  He did not answer and does not a vm set up

## 2017-05-24 ENCOUNTER — Emergency Department (HOSPITAL_COMMUNITY): Payer: BLUE CROSS/BLUE SHIELD

## 2017-05-24 ENCOUNTER — Encounter (HOSPITAL_COMMUNITY): Payer: Self-pay

## 2017-05-24 ENCOUNTER — Emergency Department (HOSPITAL_COMMUNITY)
Admission: EM | Admit: 2017-05-24 | Discharge: 2017-05-24 | Disposition: A | Discharge status: Home / Self Care | Payer: BLUE CROSS/BLUE SHIELD | Attending: Emergency Medicine | Admitting: Emergency Medicine

## 2017-05-24 DIAGNOSIS — Z87891 Personal history of nicotine dependence: Secondary | ICD-10-CM | POA: Diagnosis not present

## 2017-05-24 DIAGNOSIS — N200 Calculus of kidney: Secondary | ICD-10-CM | POA: Insufficient documentation

## 2017-05-24 DIAGNOSIS — Z79899 Other long term (current) drug therapy: Secondary | ICD-10-CM | POA: Insufficient documentation

## 2017-05-24 DIAGNOSIS — M545 Low back pain: Secondary | ICD-10-CM | POA: Diagnosis present

## 2017-05-24 LAB — URINALYSIS, ROUTINE W REFLEX MICROSCOPIC
Bacteria, UA: NONE SEEN
Bilirubin Urine: NEGATIVE
Glucose, UA: NEGATIVE mg/dL
Ketones, ur: NEGATIVE mg/dL
Nitrite: NEGATIVE
Protein, ur: NEGATIVE mg/dL
Specific Gravity, Urine: 1.017 (ref 1.005–1.030)
Squamous Epithelial / LPF: NONE SEEN
pH: 7 (ref 5.0–8.0)

## 2017-05-24 LAB — I-STAT CHEM 8, ED
BUN: 15 mg/dL (ref 6–20)
Calcium, Ion: 1.15 mmol/L (ref 1.15–1.40)
Chloride: 102 mmol/L (ref 101–111)
Creatinine, Ser: 1.3 mg/dL — ABNORMAL HIGH (ref 0.61–1.24)
Glucose, Bld: 95 mg/dL (ref 65–99)
HCT: 46 % (ref 39.0–52.0)
Hemoglobin: 15.6 g/dL (ref 13.0–17.0)
Potassium: 4 mmol/L (ref 3.5–5.1)
Sodium: 136 mmol/L (ref 135–145)
TCO2: 24 mmol/L (ref 22–32)

## 2017-05-24 MED ORDER — HYDROCODONE-ACETAMINOPHEN 5-325 MG PO TABS
2.0000 | ORAL_TABLET | Freq: Once | ORAL | Status: AC
  Administered 2017-05-24: 2 via ORAL
  Filled 2017-05-24: qty 2

## 2017-05-24 MED ORDER — HYDROCODONE-ACETAMINOPHEN 5-325 MG PO TABS: 1.0000 | ORAL_TABLET | Freq: Four times a day (QID) | ORAL | 0 refills | Status: DC | PRN

## 2017-05-24 NOTE — ED Notes (Signed)
Pt took cranberry pills and ibuprofen

## 2017-05-24 NOTE — ED Notes (Signed)
Pt complains of back pain since 1900 tonight, pt doesn't complain of any urinary trouble

## 2017-05-24 NOTE — ED Provider Notes (Signed)
[facility]-EMERGENCY DEPT Provider Note   CSN: [id number] Arrival date & time: 05/24/17  0037     History   Chief Complaint Chief Complaint  Patient presents with  . Back Pain    HPI Gwenlyn Founddrian Litke is a 31 y.o. male.  Patient presents to the emergency department with a chief complaint of left-sided low back pain.  He states pain started at 7 PM last night.  He states that it is sharp and stabbing.  He reports that it comes in waves.  He denies any nausea or vomiting.  He denies having taken anything for his symptoms.  Denies any dysuria or hematuria.  Denies any fevers chills.  He denies any other associated symptoms.  Denies any modifying factors.  The pain is not reproducible with movement or palpation.  He denies any injuries.   The history is provided by the patient. No language interpreter was used.    History reviewed. No pertinent past medical history.  Patient Active Problem List   Diagnosis Date Noted  . Bloating symptom 01/10/2015  . Routine general medical examination at a health care facility 12/15/2014  . Low back pain 12/09/2014    History reviewed. No pertinent surgical history.     Home Medications    Prior to Admission medications   Medication Sig Start Date End Date Taking? Authorizing Provider  CRANBERRY PO Take 5 capsules by mouth daily.   Yes [provider]  ibuprofen (ADVIL,MOTRIN) 200 MG tablet Take 400 mg by mouth every 6 (six) hours as needed for moderate pain.   Yes [provider]  MAGNESIUM PO Take 5 tablets by mouth daily.   Yes [provider]  Probiotic Product (PROBIOTIC PO) Take 2 capsules by mouth daily.   Yes [provider]  cholecalciferol (VITAMIN D) 1000 UNITS tablet Take 1,000 Units by mouth daily.    [provider]    Family History Family History  Problem Relation Age of Onset  . Hypertension Mother   . Hypertension Father   . Arthritis Maternal  Grandmother   . Hypertension Maternal Grandmother   . Hypertension Maternal Grandfather   . Hypertension Paternal Grandmother   . Hypertension Paternal Grandfather     Social History Social History  Substance Use Topics  . Smoking status: Former Smoker    Packs/day: 0.50    Years: 9.00    Quit date: 12/08/2009  . Smokeless tobacco: Never Used  . Alcohol use 12.6 oz/week    21 Cans of beer per week     Allergies   Patient has no known allergies.   Review of Systems Review of Systems  All other systems reviewed and are negative.    Physical Exam Updated Vital Signs BP (!) 151/111   Pulse 74   Temp 99.1 F (37.3 C) (Oral)   Resp 16   SpO2 99%   Physical Exam  Constitutional: He is oriented to person, place, and time. He appears well-developed and well-nourished.  HENT:  Head: Normocephalic and atraumatic.  Eyes: Pupils are equal, round, and reactive to light. Conjunctivae and EOM are normal. Right eye exhibits no discharge. Left eye exhibits no discharge. No scleral icterus.  Neck: Normal range of motion. Neck supple. No JVD present.  Cardiovascular: Normal rate, regular rhythm and normal heart sounds.  Exam reveals no gallop and no friction rub.   No murmur heard. Pulmonary/Chest: Effort normal and breath sounds normal. No respiratory distress. He has no wheezes. He has no  rales. He exhibits no tenderness.  Abdominal: Soft. He exhibits no distension and no mass. There is no tenderness. There is no rebound and no guarding.  Musculoskeletal: Normal range of motion. He exhibits no edema or tenderness.  Neurological: He is alert and oriented to person, place, and time.  Skin: Skin is warm and dry.  Psychiatric: He has a normal mood and affect. His behavior is normal. Judgment and thought content normal.  Nursing note and vitals reviewed.    ED Treatments / Results  Labs (all labs ordered are listed, but only abnormal results are displayed) Labs Reviewed    URINALYSIS, ROUTINE W REFLEX MICROSCOPIC    EKG  EKG Interpretation None       Radiology Ct Renal Stone Study  Result Date: 05/24/2017 CLINICAL DATA:  Acute onset of lower back pain.  Initial encounter. EXAM: CT ABDOMEN AND PELVIS WITHOUT CONTRAST TECHNIQUE: Multidetector CT imaging of the abdomen and pelvis was performed following the standard protocol without IV contrast. COMPARISON:  None. FINDINGS: Lower chest: The visualized lung bases are grossly clear. The visualized portions of the mediastinum are unremarkable. Hepatobiliary: The liver is unremarkable in appearance. The gallbladder is unremarkable in appearance. The common bile duct remains normal in caliber. Pancreas: The pancreas is within normal limits. Spleen: The spleen is unremarkable in appearance. Adrenals/Urinary Tract: The adrenal glands are unremarkable in appearance. Minimal left-sided hydronephrosis is noted, with prominence of the left ureter to the level of an obstructing 7 x 5 mm stone at the mid left ureter. Left-sided perinephric stranding is noted. Nonobstructing bilateral renal stones measure up to 5 mm in size. A small left renal cyst is noted. Stomach/Bowel: The stomach is unremarkable in appearance. The small bowel is within normal limits. The appendix is normal in caliber, without evidence of appendicitis. The colon is unremarkable in appearance. Vascular/Lymphatic: The abdominal aorta is unremarkable in appearance. The inferior vena cava is grossly unremarkable. No retroperitoneal lymphadenopathy is seen. No pelvic sidewall lymphadenopathy is identified. Reproductive: The bladder is mildly distended and grossly unremarkable in appearance. The prostate remains normal in size. Other: No additional soft tissue abnormalities are seen. Musculoskeletal: No acute osseous abnormalities are identified. The visualized musculature is unremarkable in appearance. IMPRESSION: 1. Minimal left-sided hydronephrosis, with an  obstructing 7 x 5 mm stone noted at the left mid ureter. 2. Nonobstructing bilateral renal stones measure up to 5 mm in size. 3. Small left renal cyst noted. Electronically Signed   By: Roanna Raider M.D.   On: 05/24/2017 04:24    Procedures Procedures (including critical care time)  Medications Ordered in ED Medications  HYDROcodone-acetaminophen (NORCO/VICODIN) 5-325 MG per tablet 2 tablet (not administered)     Initial Impression / Assessment and Plan / ED Course  I have reviewed the triage vital signs and the nursing notes.  Pertinent labs & imaging results that were available during my care of the patient were reviewed by me and considered in my medical decision making (see chart for details).     Vision with left flank/low back pain.  Sudden onset.  Concerning for kidney stone.  Will check urinalysis, creatinine, and CT scan.  CT shows mild hydronephrosis on the left with a 7 x 5mm obstructing ureteral stone.  Patient's pain is well controlled.  He will need to follow-up with urology.  Final Clinical Impressions(s) / ED Diagnoses   Final diagnoses:  Kidney stone    New Prescriptions New Prescriptions   HYDROCODONE-ACETAMINOPHEN (NORCO/VICODIN) 5-325 MG TABLET  Take 1-2 tablets by mouth every 6 (six) hours as needed.     Roxy Horseman, PA-C 05/24/17 3235    Paula Libra, MD 05/24/17 [phone]

## 2017-12-26 ENCOUNTER — Ambulatory Visit (INDEPENDENT_AMBULATORY_CARE_PROVIDER_SITE_OTHER): Payer: BLUE CROSS/BLUE SHIELD | Admitting: Nurse Practitioner

## 2017-12-26 ENCOUNTER — Encounter: Payer: Self-pay | Admitting: Nurse Practitioner

## 2017-12-26 ENCOUNTER — Encounter

## 2017-12-26 ENCOUNTER — Other Ambulatory Visit (INDEPENDENT_AMBULATORY_CARE_PROVIDER_SITE_OTHER): Payer: BLUE CROSS/BLUE SHIELD

## 2017-12-26 VITALS — BP 130/98 | HR 79 | Temp 98.6°F | Resp 16 | Ht 67.0 in | Wt 164.0 lb

## 2017-12-26 DIAGNOSIS — R030 Elevated blood-pressure reading, without diagnosis of hypertension: Secondary | ICD-10-CM

## 2017-12-26 DIAGNOSIS — R140 Abdominal distension (gaseous): Secondary | ICD-10-CM | POA: Diagnosis not present

## 2017-12-26 LAB — CBC
HCT: 42.4 % (ref 39.0–52.0)
Hemoglobin: 14.1 g/dL (ref 13.0–17.0)
MCHC: 33.2 g/dL (ref 30.0–36.0)
MCV: 88.3 fl (ref 78.0–100.0)
Platelets: 234 10*3/uL (ref 150.0–400.0)
RBC: 4.81 Mil/uL (ref 4.22–5.81)
RDW: 14 % (ref 11.5–15.5)
WBC: 4.8 10*3/uL (ref 4.0–10.5)

## 2017-12-26 LAB — COMPREHENSIVE METABOLIC PANEL
ALT: 15 U/L (ref 0–53)
AST: 19 U/L (ref 0–37)
Albumin: 4.4 g/dL (ref 3.5–5.2)
Alkaline Phosphatase: 64 U/L (ref 39–117)
BUN: 13 mg/dL (ref 6–23)
CO2: 29 mEq/L (ref 19–32)
Calcium: 9.3 mg/dL (ref 8.4–10.5)
Chloride: 107 mEq/L (ref 96–112)
Creatinine, Ser: 1.19 mg/dL (ref 0.40–1.50)
GFR: 90.67 mL/min (ref >60.00)
Glucose, Bld: 81 mg/dL (ref 70–99)
Potassium: 3.8 mEq/L (ref 3.5–5.1)
Sodium: 141 mEq/L (ref 135–145)
Total Bilirubin: 1.2 mg/dL (ref 0.2–1.2)
Total Protein: 7.4 g/dL (ref 6.0–8.3)

## 2017-12-26 MED ORDER — PANTOPRAZOLE SODIUM 40 MG PO TBEC: 40.0000 mg | DELAYED_RELEASE_TABLET | Freq: Every day | ORAL | 1 refills | Status: DC

## 2017-12-26 NOTE — Progress Notes (Signed)
Name: Gary Montgomery   MRN: [id number]    DOB: 1985-08-13   Date:12/28/2017       Progress Note  Subjective  Chief Complaint  Chief Complaint  Patient presents with  . Establish Care    stomach issues, states he has a hx of alcohol abuse     HPI Gary Montgomery is here today to establish care with me, transferring from another provider in our practice, last seen here on 01/10/15 for abdominal bloating. He would like to to discuss abdominal bloating today. He reports a feeling of abdominal fullness and bloating that occurs daily He reports decreased appetite, often eats one meal a day because he is just not hungry. He sometimes experiences heartburn with certain foods, like spicy foods, but does not experience heartburn daily. He notices that after he has a bowel movement the bloating is relieved for a short period of time but always returns. He denies fevers, chills, nausea, vomiting, abdominal pain, rectal bleeding, dysuria, hematuria, edema. He takes mag citrate daily to help himself go to the bathroom-he has 2 bowel movements every day. He was a heavy drinker in the past- he completely stopped drinking alcohol in February.  Wt Readings from Last 3 Encounters:  12/26/17 164 lb (74.4 kg)  01/10/15 167 lb 1.9 oz (75.8 kg)  12/15/14 170 lb 6.4 oz (77.3 kg)    Patient Active Problem List   Diagnosis Date Noted  . Bloating symptom 01/10/2015  . Routine general medical examination at a health care facility 12/15/2014  . Low back pain 12/09/2014    Social History   Tobacco Use  . Smoking status: Former Smoker    Packs/day: 0.50    Years: 9.00    Pack years: 4.50    Last attempt to quit: 12/08/2009    Years since quitting: 8.0  . Smokeless tobacco: Never Used  Substance Use Topics  . Alcohol use: Yes    Alcohol/week: 12.6 oz    Types: 21 Cans of beer per week     Current Outpatient Medications:  Marland Kitchen  MAGNESIUM PO, Take 5 tablets by mouth daily., Disp: , Rfl:  .  pantoprazole  (PROTONIX) 40 MG tablet, Take 1 tablet (40 mg total) by mouth daily., Disp: 30 tablet, Rfl: 1  No Known Allergies  ROS  No other specific complaints in a complete review of systems (except as listed in HPI above).  Objective  Vitals:   12/26/17 1514 12/28/17 1030  BP: (!) 138/92 (!) 130/98  Pulse: 79   Resp: 16   Temp: 98.6 F (37 C)   TempSrc: Oral   SpO2: 96%   Weight: 164 lb (74.4 kg)   Height:  (1.702 m)    Body mass index is 25.69 kg/m.  Nursing Note and Vital Signs reviewed.  Physical Exam  Constitutional: Patient appears well-developed and well-nourished. No distress.  HEENT: head atraumatic, normocephalic, pupils equal and reactive to light, EOM's intact,  neck supple without lymphadenopathy, oropharynx pink and moist without exudate Cardiovascular: Normal rate, regular rhythm, S1/S2 present.  No BLE edema. Distal pulses intact Pulmonary/Chest: Effort normal and breath sounds clear. No respiratory distress or retractions. Abdominal: Soft. Bowel sounds are normal, no distension. There is no tenderness. no masses Musculoskeletal: Normal range of motion, . No gross deformities Neurological: He is alert and oriented to person, place, and time. No cranial nerve deficit. Coordination, balance, strength, speech and gait are normal.  Skin: Skin is warm and dry. No rash noted. No erythema.  Psychiatric: Patient has a normal mood and affect. behavior is normal. Judgment and thought content normal.    Assessment & Plan RTC in 1 month for CPE, F/U: elevated blood pressure  -Red flags and when to present for emergency care or RTC including fever >101.73F, chest pain, shortness of breath, new/worsening/un-resolving symptoms, reviewed with patient at time of visit. Follow up and care instructions discussed during OV  1. Abdominal bloating Discussed food triggers and lifestyle modifications for gerd and printed additional information in avs Will initiate PPI therapy for  GERD symptoms and refer to GI for further E&M of abdominal bloating - CBC; Future - Comprehensive metabolic panel; Future - US Abdomen Complete; Future - Ambulatory referral to Gastroenterology - pantoprazole (PROTONIX) 40 MG tablet; Take 1 tablet (40 mg total) by mouth daily.  Dispense: 30 tablet; Refill: 1-dosing and side effects discussed  2. Elevated blood pressure reading BP elevated on 2 readings in clinic today We discussed the long term risks of uncontrolled blood pressure and the role of healthy, low sodium diet and exercise in the management of HTN RTC in 1 month for F/U, if blood pressure remains elevated will recommend initiation of antihypertensive agent

## 2017-12-26 NOTE — Patient Instructions (Addendum)
Please head downstairs for lab work/x-rays. If any of your test results are critically abnormal, you will be contacted right away. Your results may be released to your MyChart for viewing before I am able to provide you with my response. I will contact you within a week about your test results and any recommendations for abnormalities.  I have placed a referral to gastroenterology and  An order for an ultrasound of your abdomen. Our office will call you to schedule this appointment. You should hear from our office in 7-10 days.  I have sent a prescription for protonix  once daily for acid reflux. Please start this medication  Please come back to see me in about 1 month for an annual physical and blood pressure recheck.   Food Choices for Gastroesophageal Reflux Disease, Adult When you have gastroesophageal reflux disease (GERD), the foods you eat and your eating habits are very important. Choosing the right foods can help ease your discomfort. What guidelines do I need to follow?  Choose fruits, vegetables, whole grains, and low-fat dairy products.  Choose low-fat meat, fish, and poultry.  Limit fats such as oils, salad dressings, butter, nuts, and avocado.  Keep a food diary. This helps you identify foods that cause symptoms.  Avoid foods that cause symptoms. These may be different for everyone.  Eat small meals often instead of 3 large meals a day.  Eat your meals slowly, in a place where you are relaxed.  Limit fried foods.  Cook foods using methods other than frying.  Avoid drinking alcohol.  Avoid drinking large amounts of liquids with your meals.  Avoid bending over or lying down until 2-3 hours after eating. What foods are not recommended? These are some foods and drinks that may make your symptoms worse: Vegetables Tomatoes. Tomato juice. Tomato and spaghetti sauce. Chili peppers. Onion and garlic. Horseradish. Fruits Oranges, grapefruit, and lemon (fruit and  juice). Meats High-fat meats, fish, and poultry. This includes hot dogs, ribs, ham, sausage, salami, and bacon. Dairy Whole milk and chocolate milk. Sour cream. Cream. Butter. Ice cream. Cream cheese. Drinks Coffee and tea. Bubbly (carbonated) drinks or energy drinks. Condiments Hot sauce. Barbecue sauce. Sweets/Desserts Chocolate and cocoa. Donuts. Peppermint and spearmint. Fats and Oils High-fat foods. This includes [place] fries and potato chips. Other Vinegar. Strong spices. This includes black pepper, white pepper, red pepper, cayenne, curry powder, cloves, ginger, and chili powder. The items listed above may not be a complete list of foods and drinks to avoid. Contact your dietitian for more information. This information is not intended to replace advice given to you by your health care provider. Make sure you discuss any questions you have with your health care provider. Document Released: 01/14/2012 Document Revised: 12/21/2015 Document Reviewed: 05/19/2013 Elsevier Interactive Patient Education  2017 [employer].

## 2017-12-28 ENCOUNTER — Encounter: Payer: Self-pay | Admitting: Nurse Practitioner

## 2018-01-04 ENCOUNTER — Encounter: Payer: Self-pay | Admitting: Nurse Practitioner

## 2018-01-08 ENCOUNTER — Encounter: Payer: Self-pay | Admitting: Gastroenterology

## 2018-01-13 ENCOUNTER — Ambulatory Visit
Admission: RE | Admit: 2018-01-13 | Discharge: 2018-01-13 | Disposition: A | Discharge status: Home / Self Care | Payer: BLUE CROSS/BLUE SHIELD | Source: Ambulatory Visit | Attending: Nurse Practitioner | Admitting: Nurse Practitioner

## 2018-01-13 DIAGNOSIS — R140 Abdominal distension (gaseous): Secondary | ICD-10-CM

## 2018-01-22 ENCOUNTER — Ambulatory Visit (INDEPENDENT_AMBULATORY_CARE_PROVIDER_SITE_OTHER): Payer: BLUE CROSS/BLUE SHIELD | Admitting: Gastroenterology

## 2018-01-22 ENCOUNTER — Encounter: Payer: Self-pay | Admitting: Gastroenterology

## 2018-01-22 VITALS — BP 110/80 | HR 72 | Ht 69.5 in | Wt 163.2 lb

## 2018-01-22 DIAGNOSIS — R140 Abdominal distension (gaseous): Secondary | ICD-10-CM | POA: Diagnosis not present

## 2018-01-22 NOTE — Patient Instructions (Signed)
Discontinue your Magnesium.   Please call the office with an update of your symptoms in the next two weeks and ask for Hilma FavorsPatty Phelps, RN, BSN.

## 2018-01-22 NOTE — Progress Notes (Signed)
01/22/2018 Gary Montgomery [id number] Feb 24, 1986   HISTORY OF PRESENT ILLNESS:  This is a 32 year old male who is new to our office, referred by Alphonse GuildAshleigh Shambley, NP.  He presents here today with complaints of abdominal bloating for 2-3 years.  He says that this is present no matter what he eats or drinks, even water.  No abdominal pain.  Has some looser type stools, but not diarrhea.  Previously was drinking a lot of ETOH daily but now has maybe one beer per month and symptoms still present.  No abdominal pain or other symptoms.  CT scan without contrast in 04/2017 was ok except for kidney stones.  Ultrasound this month showed hemangioma but otherwise ok (PCP is ordering another ultrasound at 6 month interval to ensure stability).  He has been on magnesium for the past 2 years or so as well for "good bone and teeth health".  Says that he really does not have an appetite, only eats once a day, but does eat a lot of vegetables, etc.  No weight loss, nausea, vomiting, dark or bloody stools, fevers, chills, heartburn/reflux, issues with swallowing.  No NSAID use. Has tried a probiotic in the past without improvement.     Past Medical History:  Diagnosis Date  . Alcoholism (HCC)   . Kidney stones    Past Surgical History:  Procedure Laterality Date  . DENTAL SURGERY      reports that he quit smoking about 8 years ago. He has a 4.50 pack-year smoking history. He has never used smokeless tobacco. He reports that he drinks about 12.6 oz of alcohol per week. He reports that he does not use drugs. family history includes Arthritis in his maternal grandmother; Heart attack in his maternal grandmother; Hypertension in his father, maternal grandfather, maternal grandmother, mother, paternal grandfather, and paternal grandmother. No Known Allergies    Outpatient Encounter Medications as of 01/22/2018  Medication Sig  . MAGNESIUM PO Take 5 tablets by mouth daily.  . [DISCONTINUED] pantoprazole  (PROTONIX) 40 MG tablet Take 1 tablet (40 mg total) by mouth daily.   No facility-administered encounter medications on file as of 01/22/2018.      REVIEW OF SYSTEMS  : All other systems reviewed and negative except where noted in the History of Present Illness.   PHYSICAL EXAM: BP 110/80 (BP Location: Left Arm, Patient Position: Sitting, Cuff Size: Normal)   Pulse 72   Ht 5' 9.5" (1.765 m) Comment: height measured without shoes  Wt 163 lb 4 oz (74 kg)   BMI 23.76 kg/m  General: Well developed black male in no acute distress Head: Normocephalic and atraumatic Eyes:  Sclerae anicteric, conjunctiva pink. Ears: Normal auditory acuity Lungs: Clear throughout to auscultation; no increased WOB. Heart: Regular rate and rhythm; no M/R/G. Abdomen: Soft, non-distended.  BS present.  Non-tender. Musculoskeletal: Symmetrical with no gross deformities  Skin: No lesions on visible extremities Extremities: No edema  Neurological: Alert oriented x 4, grossly non-focal Psychological:  Alert and cooperative. Normal mood and affect  ASSESSMENT AND PLAN: *32 year old male with complaints of abdominal bloating for 2-3 years:  Present no matter what he eats or drinks, even water.  No abdominal pain.  Previously was drinking a lot of ETOH daily but now has maybe one beer per month and symptoms still present.  No abdominal pain or other symptoms.  CT scan without contrast in 04/2017 was ok.  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  He has been on magnesium for the past 2 years or so as well for "good bone and teeth health".  ? If the magnesium is causing his bloating.  Regardless, I do not think there is anything pathologic causing his symptoms.  I have asked him to discontinue his magnesium for 2 weeks to see if that helps and then call our office back to let us know the results.  I also think that his diet may be contributing as he eats a lot of vegetables and fiber.   CC:  Evaristo Bury, NP

## 2018-01-23 NOTE — Progress Notes (Signed)
Reviewed and agree with documentation and assessment and plan. K. Veena Darien Mignogna , MD   

## 2018-02-05 ENCOUNTER — Telehealth: Payer: Self-pay | Admitting: Gastroenterology

## 2018-02-05 DIAGNOSIS — R140 Abdominal distension (gaseous): Secondary | ICD-10-CM

## 2018-02-05 NOTE — Telephone Encounter (Signed)
The pt states that the bloating has decreased significantly with dietary changes.  However, the full feeling has not really improved very much.  He stopped the magnesium and altered his diet to include less fiber.  Please advise

## 2018-02-05 NOTE — Telephone Encounter (Signed)
Left message on machine to call back lab order in Epic

## 2018-02-05 NOTE — Telephone Encounter (Signed)
Please check celiac labs, TTG and IgA, and have him do the breath test for SIBO.  Thank you,  Jess

## 2018-02-06 NOTE — Telephone Encounter (Signed)
Left message on machine to call back  

## 2018-02-09 NOTE — Telephone Encounter (Signed)
I spoke with the pt and he will come in for labs and pick up the SIBO kit from our office.

## 2018-02-10 ENCOUNTER — Emergency Department (HOSPITAL_COMMUNITY)
Admission: EM | Admit: 2018-02-10 | Discharge: 2018-02-10 | Disposition: A | Discharge status: Home / Self Care | Payer: BLUE CROSS/BLUE SHIELD | Attending: Emergency Medicine | Admitting: Emergency Medicine

## 2018-02-10 ENCOUNTER — Encounter (HOSPITAL_COMMUNITY): Payer: Self-pay

## 2018-02-10 ENCOUNTER — Emergency Department (HOSPITAL_COMMUNITY): Payer: BLUE CROSS/BLUE SHIELD

## 2018-02-10 ENCOUNTER — Other Ambulatory Visit: Payer: Self-pay

## 2018-02-10 DIAGNOSIS — R3129 Other microscopic hematuria: Secondary | ICD-10-CM | POA: Diagnosis not present

## 2018-02-10 DIAGNOSIS — N200 Calculus of kidney: Secondary | ICD-10-CM

## 2018-02-10 DIAGNOSIS — Z79899 Other long term (current) drug therapy: Secondary | ICD-10-CM | POA: Diagnosis not present

## 2018-02-10 DIAGNOSIS — R109 Unspecified abdominal pain: Secondary | ICD-10-CM | POA: Diagnosis present

## 2018-02-10 DIAGNOSIS — Z87891 Personal history of nicotine dependence: Secondary | ICD-10-CM | POA: Insufficient documentation

## 2018-02-10 LAB — BASIC METABOLIC PANEL
Anion gap: 9 (ref 5–15)
BUN: 15 mg/dL (ref 6–20)
CO2: 29 mmol/L (ref 22–32)
Calcium: 9.7 mg/dL (ref 8.9–10.3)
Chloride: 108 mmol/L (ref 98–111)
Creatinine, Ser: 1.18 mg/dL (ref 0.61–1.24)
GFR calc Af Amer: >60 mL/min (ref >60)
GFR calc non Af Amer: >60 mL/min (ref >60)
Glucose, Bld: 67 mg/dL — ABNORMAL LOW (ref 70–99)
Potassium: 3.5 mmol/L (ref 3.5–5.1)
Sodium: 146 mmol/L — ABNORMAL HIGH (ref 135–145)

## 2018-02-10 LAB — URINALYSIS, ROUTINE W REFLEX MICROSCOPIC
Bilirubin Urine: NEGATIVE
Glucose, UA: NEGATIVE mg/dL
Ketones, ur: NEGATIVE mg/dL
Nitrite: NEGATIVE
Protein, ur: NEGATIVE mg/dL
RBC / HPF: >50 RBC/hpf — ABNORMAL HIGH (ref 0–5)
Specific Gravity, Urine: 1.02 (ref 1.005–1.030)
WBC, UA: >50 WBC/hpf — ABNORMAL HIGH (ref 0–5)
pH: 6 (ref 5.0–8.0)

## 2018-02-10 LAB — CBC WITH DIFFERENTIAL/PLATELET
Basophils Absolute: 0 10*3/uL (ref 0.0–0.1)
Basophils Relative: 0 %
Eosinophils Absolute: 0 10*3/uL (ref 0.0–0.7)
Eosinophils Relative: 1 %
HCT: 45.9 % (ref 39.0–52.0)
Hemoglobin: 15 g/dL (ref 13.0–17.0)
Lymphocytes Relative: 34 %
Lymphs Abs: 2 10*3/uL (ref 0.7–4.0)
MCH: 29.4 pg (ref 26.0–34.0)
MCHC: 32.7 g/dL (ref 30.0–36.0)
MCV: 90 fL (ref 78.0–100.0)
Monocytes Absolute: 0.7 10*3/uL (ref 0.1–1.0)
Monocytes Relative: 12 %
Neutro Abs: 3.2 10*3/uL (ref 1.7–7.7)
Neutrophils Relative %: 53 %
Platelets: 251 10*3/uL (ref 150–400)
RBC: 5.1 MIL/uL (ref 4.22–5.81)
RDW: 13.9 % (ref 11.5–15.5)
WBC: 6 10*3/uL (ref 4.0–10.5)

## 2018-02-10 MED ORDER — ONDANSETRON 4 MG PO TBDP: 4.0000 mg | ORAL_TABLET | Freq: Three times a day (TID) | ORAL | 0 refills | Status: DC | PRN

## 2018-02-10 MED ORDER — NAPROXEN 500 MG PO TABS: 500.0000 mg | ORAL_TABLET | Freq: Two times a day (BID) | ORAL | 0 refills | Status: DC | PRN

## 2018-02-10 MED ORDER — HYDROCODONE-ACETAMINOPHEN 5-325 MG PO TABS: 1.0000 | ORAL_TABLET | Freq: Four times a day (QID) | ORAL | 0 refills | Status: DC | PRN

## 2018-02-10 MED ORDER — TAMSULOSIN HCL 0.4 MG PO CAPS: 0.4000 mg | ORAL_CAPSULE | Freq: Every day | ORAL | 0 refills | Status: DC

## 2018-02-10 NOTE — ED Notes (Signed)
Called for room x 1.  

## 2018-02-10 NOTE — ED Provider Notes (Signed)
[facility]-EMERGENCY DEPT Provider Note   CSN: [id number] Arrival date & time: 02/10/18  4098     History   Chief Complaint Chief Complaint  Patient presents with  . Flank Pain    HPI Gary Montgomery is a 32 y.o. male with a PMHx of nephrolithiasis, who presents to the ED with complaints of 1 day of left flank pain that is currently improved.  Patient states that yesterday around 3 PM he had sudden onset left flank pain that radiated into the left groin, describing it as a 10/10 constant dull pain that worsens with movement and urinating, and was improved with cranberry juice and "Stone breaker" over-the-counter supplement.  He states that currently his pain is pretty much resolved, although he is not sure whether he passed his kidney stone.  He states this feels like all prior kidney stones.  He reports that he came here because he "wanted to make sure" that he didn't still have a kidney stone.  He does not have a urologist that he sees.  He denies fevers, chills, CP, SOB, nausea/vomiting, diarrhea/constipation, obstipation, melena, hematochezia, hematuria, dysuria, urinary frequency, testicular pain/swelling, penile discharge, myalgias, arthralgias, numbness, tingling, focal weakness, or any other complaints at this time. Denies recent travel, sick contacts, suspicious food intake, EtOH use, or NSAID use.  The history is provided by the patient and medical records. No language interpreter was used.  Flank Pain  Associated symptoms include abdominal pain. Pertinent negatives include no chest pain and no shortness of breath.    Past Medical History:  Diagnosis Date  . Alcoholism (HCC)   . Kidney stones     Patient Active Problem List   Diagnosis Date Noted  . Bloating 01/10/2015  . Routine general medical examination at a health care facility 12/15/2014  . Low back pain 12/09/2014    Past Surgical History:  Procedure Laterality Date  . DENTAL SURGERY           Home Medications    Prior to Admission medications   Medication Sig Start Date End Date Taking? Authorizing Provider  MAGNESIUM PO Take 5 tablets by mouth daily.    [provider]    Family History Family History  Problem Relation Age of Onset  . Hypertension Mother   . Hypertension Father   . Arthritis Maternal Grandmother   . Hypertension Maternal Grandmother   . Heart attack Maternal Grandmother   . Hypertension Maternal Grandfather   . Hypertension Paternal Grandmother   . Hypertension Paternal Grandfather     Social History Social History   Tobacco Use  . Smoking status: Former Smoker    Packs/day: 0.50    Years: 9.00    Pack years: 4.50    Last attempt to quit: 12/08/2009    Years since quitting: 8.1  . Smokeless tobacco: Never Used  Substance Use Topics  . Alcohol use: Yes    Alcohol/week: 12.6 oz    Types: 21 Cans of beer per week    Comment: 1 beer socially  . Drug use: No     Allergies   Patient has no known allergies.   Review of Systems Review of Systems  Constitutional: Negative for chills and fever.  Respiratory: Negative for shortness of breath.   Cardiovascular: Negative for chest pain.  Gastrointestinal: Positive for abdominal pain. Negative for blood in stool, constipation, diarrhea, nausea and vomiting.  Genitourinary: Positive for flank pain. Negative for discharge, dysuria, frequency, hematuria, scrotal swelling and testicular pain.  Musculoskeletal: Negative for arthralgias and myalgias.  Skin: Negative for color change.  Allergic/Immunologic: Negative for immunocompromised state.  Neurological: Negative for weakness and numbness.  Psychiatric/Behavioral: Negative for confusion.   All other systems reviewed and are negative for acute change except as noted in the HPI.    Physical Exam Updated Vital Signs BP (!) 143/103 (BP Location: Left Arm)   Pulse 65   Temp 98.3 F (36.8 C) (Oral)   Resp 16   Ht 5' 9.5"  (1.765 m)   Wt 75.8 kg (167 lb)   SpO2 100%   BMI 24.31 kg/m   Physical Exam  Constitutional: He is oriented to person, place, and time. Vital signs are normal. He appears well-developed and well-nourished.  Non-toxic appearance. No distress.  Afebrile, nontoxic, NAD  HENT:  Head: Normocephalic and atraumatic.  Mouth/Throat: Oropharynx is clear and moist and mucous membranes are normal.  Eyes: Conjunctivae and EOM are normal. Right eye exhibits no discharge. Left eye exhibits no discharge.  Neck: Normal range of motion. Neck supple.  Cardiovascular: Normal rate, regular rhythm, normal heart sounds and intact distal pulses. Exam reveals no gallop and no friction rub.  No murmur heard. Pulmonary/Chest: Effort normal and breath sounds normal. No respiratory distress. He has no decreased breath sounds. He has no wheezes. He has no rhonchi. He has no rales.  Abdominal: Soft. Normal appearance and bowel sounds are normal. He exhibits no distension. There is no tenderness. There is no rigidity, no rebound, no guarding, no CVA tenderness, no tenderness at McBurney's point and negative Murphy's sign.  Soft, NTND, +BS throughout, no r/g/r, neg murphy's, neg mcburney's, no CVA TTP   Musculoskeletal: Normal range of motion.  Neurological: He is alert and oriented to person, place, and time. He has normal strength. No sensory deficit.  Skin: Skin is warm, dry and intact. No rash noted.  Psychiatric: He has a normal mood and affect.  Nursing note and vitals reviewed.    ED Treatments / Results  Labs (all labs ordered are listed, but only abnormal results are displayed) Labs Reviewed  URINALYSIS, ROUTINE W REFLEX MICROSCOPIC - Abnormal; Notable for the following components:      Result Value   APPearance HAZY (*)    Hgb urine dipstick LARGE (*)    Leukocytes, UA LARGE (*)    RBC / HPF >50 (*)    WBC, UA >50 (*)    Bacteria, UA RARE (*)    All other components within normal limits  BASIC  METABOLIC PANEL - Abnormal; Notable for the following components:   Sodium 146 (*)    Glucose, Bld 67 (*)    All other components within normal limits  URINE CULTURE  CBC WITH DIFFERENTIAL/PLATELET    EKG None  Radiology Ct Renal Stone Study  Result Date: 02/10/2018 CLINICAL DATA:  LEFT flank pain radiating to LEFT lower quadrant since yesterday, improved today. Possibly passed kidney stone. History of kidney stones and alcoholism. EXAM: CT ABDOMEN AND PELVIS WITHOUT CONTRAST TECHNIQUE: Multidetector CT imaging of the abdomen and pelvis was performed following the standard protocol without IV contrast. COMPARISON:  Abdominal ultrasound January 13, 2018 FINDINGS: LOWER CHEST: Lung bases are clear. The visualized heart size is normal. No pericardial effusion. HEPATOBILIARY: Normal. PANCREAS: Normal. SPLEEN: Normal. ADRENALS/URINARY TRACT: Kidneys are orthotopic, demonstrating normal size and morphology. Mild LEFT hydroureteronephrosis. 3 mm depending calculus LEFT bladder. 4 mm RIGHT lower pole nephrolithiasis. Punctate bilateral nephrolithiasis. No hydronephrosis; limited assessment for renal masses by nonenhanced  CT. 2.2 cm homogeneously hypodense benign-appearing cyst lower pole LEFT kidney. Urinary bladder is partially distended and unremarkable. Normal adrenal glands. STOMACH/BOWEL: The stomach, small and large bowel are normal in course and caliber without inflammatory changes, sensitivity decreased by lack of enteric contrast. Mild sigmoid colonic diverticulosis. Normal appendix. VASCULAR/LYMPHATIC: Aortoiliac vessels are normal in course and caliber. No lymphadenopathy by CT size criteria. REPRODUCTIVE: Normal. OTHER: No intraperitoneal free fluid or free air. Phleboliths LEFT pelvis. MUSCULOSKELETAL: Non-acute. IMPRESSION: 1. 3 mm LEFT bladder calculus, likely recently passed with mild residual hydroureteronephrosis. 2. Bilateral nephrolithiasis measuring to 4 mm on the RIGHT. Electronically Signed    By: Awilda Metroourtnay  Bloomer M.D.   On: 02/10/2018 18:37    Procedures Procedures (including critical care time)  Medications Ordered in ED Medications - No data to display   Initial Impression / Assessment and Plan / ED Course  I have reviewed the triage vital signs and the nursing notes.  Pertinent labs & imaging results that were available during my care of the patient were reviewed by me and considered in my medical decision making (see chart for details).     32 y.o. male here with L flank pain x1 day radiating to LLQ/L groin area, currently improved, feels like prior kidney stones. He's not sure if he may have already passed it but wants to "make sure". On exam, no abdominal or flank tenderness. Discussed options of doing nothing and assuming the stone has passed, vs checking KUB for stone that's there now, vs checking CT renal to see if stone is present or if it looks like a stone had recently passed (advised that we may end up not seeing anything even if he did have a recently passed stone). Pt wants to verify with CT renal. Will get labs and U/A, pt declines pain meds, will reassess shortly.   8:01 PM CBC w/diff WNL. BMP with marginally low gluc 67 and marginally elevated Na 146 but otherwise WNL. U/A with large hgb, large leuks, no nitrites, >50 RBCs and WBCs, rare bacteria, 0-5 squamous, and mucus present; doubt UTI, this is likely from the kidney stone, will send for UCx but doubt need for empiric UTI tx. CT renal showing 3mm L bladder calculus likely recently pass with mild residual hydroureteronephrosis, also with b/l nephrolithiasis in kidneys, up to 4mm in the lower pole of R kidney. Pt feels well, tolerating PO well here, and is safe for d/c at this time. Will send home with zofran, naprosyn, norco, and flomax in case the stone on the R side decides to try to move. Urine strainer given. Advised staying hydrated. F/up with urology in 1-2wks but strict return precautions advised. I  explained the diagnosis and have given explicit precautions to return to the ER including for any other new or worsening symptoms. The patient understands and accepts the medical plan as it's been dictated and I have answered their questions. Discharge instructions concerning home care and prescriptions have been given. The patient is STABLE and is discharged to home in good condition.   [place] reviewed prior to dispensing controlled substance medications, and 2 year search was notable for: norco 5-325mg  #15 tabs on 05/26/17 and #12 tabs on 12/11/16, no other controlled substances found. Risks/benefits/alternatives and expectations discussed regarding controlled substances. Side effects of medications discussed. Informed consent obtained.    Final Clinical Impressions(s) / ED Diagnoses   Final diagnoses:  Nephrolithiasis  Left flank pain  Other microscopic hematuria    ED Discharge Orders  Ordered    HYDROcodone-acetaminophen (NORCO) 5-325 MG tablet  Every 6 hours PRN     02/10/18 1959    naproxen (NAPROSYN) 500 MG tablet  2 times daily PRN     02/10/18 1959    tamsulosin (FLOMAX) 0.4 MG CAPS capsule  Daily after supper     02/10/18 1959    Strain all urine     02/10/18 1959    ondansetron (ZOFRAN ODT) 4 MG disintegrating tablet  Every 8 hours PRN     02/10/18 [address] [place], [place] 02/10/18 2001    Terrilee Files, MD 02/11/18 1438

## 2018-02-10 NOTE — ED Triage Notes (Signed)
Patient c/o left flank pain that radiates into the left lower abdomen since yesterday at 1530.Marland Kitchen. Patient reports that when he urinates the pain increases. Patient reports a history of kidney stones.

## 2018-02-10 NOTE — Discharge Instructions (Addendum)
The kidney stone you had in your left side has already passed into your bladder, so it should pass completely fairly soon. You have another kidney stone in the right kidney which hasn't moved, however if it does decide to move then you may have return of symptoms. Take naprosyn as directed as needed for pain using norco for breakthrough pain. Do not drive or operate machinery with pain medication use. May need over-the-counter stool softener with this pain medication use. Use Zofran as needed for nausea. Use Flomax as directed IF YOU HAVE RETURN OF SYMPTOMS that make you think you're passing the other kidney stone; this medication will help you pass the stone; you DO NOT need to take it if your symptoms don't come back, because the left kidney stone has already passed through to your bladder so the Flomax won't do much for this stone now. Strain all urine to try to catch the stone when it passes. Follow-up with the urologist in the next 1 to 2 weeks for recheck of ongoing pain, however for intractable or uncontrollable symptoms at home then [facility].

## 2018-02-11 ENCOUNTER — Other Ambulatory Visit (INDEPENDENT_AMBULATORY_CARE_PROVIDER_SITE_OTHER): Payer: BLUE CROSS/BLUE SHIELD

## 2018-02-11 DIAGNOSIS — R140 Abdominal distension (gaseous): Secondary | ICD-10-CM

## 2018-02-11 LAB — IGA: IgA: 170 mg/dL (ref 68–378)

## 2018-02-12 LAB — URINE CULTURE: Culture: NO GROWTH

## 2018-02-12 LAB — TISSUE TRANSGLUTAMINASE, IGA: (tTG) Ab, IgA: 1 U/mL

## 2018-07-15 ENCOUNTER — Telehealth: Payer: Self-pay | Admitting: Nurse Practitioner

## 2018-07-15 ENCOUNTER — Other Ambulatory Visit: Payer: Self-pay | Admitting: Nurse Practitioner

## 2018-07-15 DIAGNOSIS — R935 Abnormal findings on diagnostic imaging of other abdominal regions, including retroperitoneum: Secondary | ICD-10-CM

## 2018-07-15 DIAGNOSIS — R9389 Abnormal findings on diagnostic imaging of other specified body structures: Secondary | ICD-10-CM

## 2018-07-15 NOTE — Telephone Encounter (Signed)
Pt informed of below.  

## 2018-07-15 NOTE — Telephone Encounter (Signed)
-----   Message from Evaristo BuryAshleigh N Renesha Lizama, NP sent at 01/13/2018  3:26 PM EDT ----- Repeat US ABDOMEN COMPLETE for abnormal (likely benign) findings on 01/13/18

## 2018-07-15 NOTE — Telephone Encounter (Signed)
I have placed an order for an ultrasound, this is for a 6 month follow up of his June ultrasound which showed cyst on kidney and benign growth on liver- to check stability of these findings

## 2018-07-27 ENCOUNTER — Other Ambulatory Visit: Payer: BLUE CROSS/BLUE SHIELD

## 2018-07-31 ENCOUNTER — Ambulatory Visit
Admission: RE | Admit: 2018-07-31 | Discharge: 2018-07-31 | Disposition: A | Discharge status: Home / Self Care | Payer: BLUE CROSS/BLUE SHIELD | Source: Ambulatory Visit | Attending: Nurse Practitioner | Admitting: Nurse Practitioner

## 2018-07-31 DIAGNOSIS — R935 Abnormal findings on diagnostic imaging of other abdominal regions, including retroperitoneum: Secondary | ICD-10-CM

## 2018-10-07 IMAGING — CT CT RENAL STONE PROTOCOL
2 of 4 series · 16 of 46 positions shown, 18 images · non-contrast
Comparison: Abdominal ultrasound January 13, 2018

CLINICAL DATA: LEFT flank pain radiating to LEFT lower quadrant
since yesterday, improved today. Possibly passed kidney stone.
History of kidney stones and alcoholism.

EXAM:
CT ABDOMEN AND PELVIS WITHOUT CONTRAST
TECHNIQUE: Multidetector CT imaging of the abdomen and pelvis was performed
following the standard protocol without IV contrast.

[Series 2: axial st · axial · 0.80mm/px · z∈[+1020,+1440]mm · 13 of 94 slices shown, 15 images]
[im 5/94  soft-tissue]
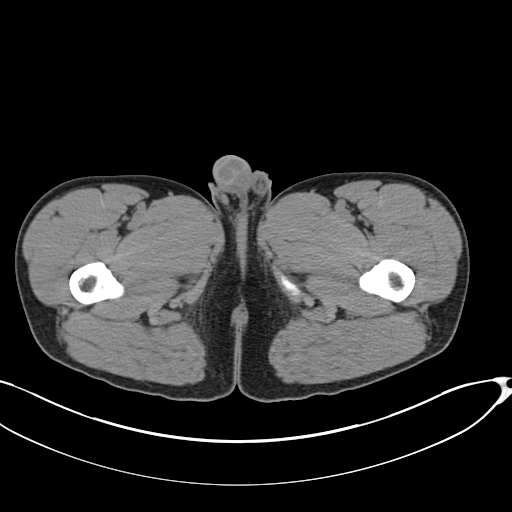
[im 5/94  bone]
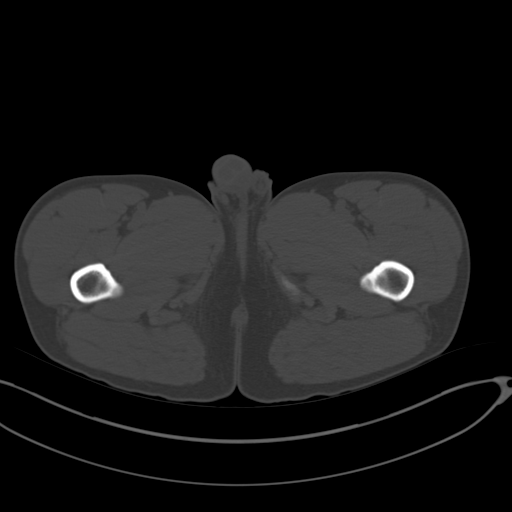
[im 15/94  soft-tissue]
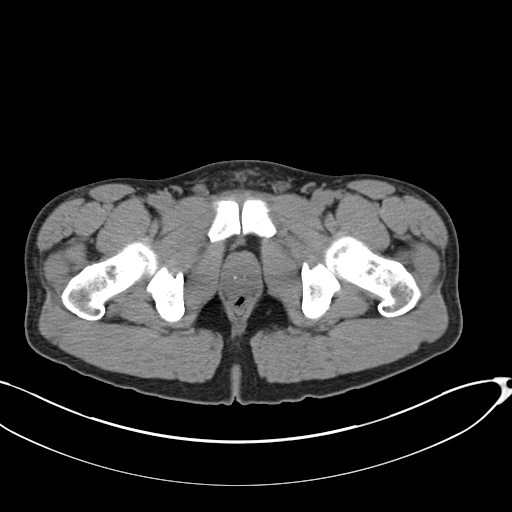
[im 20/94  soft-tissue]
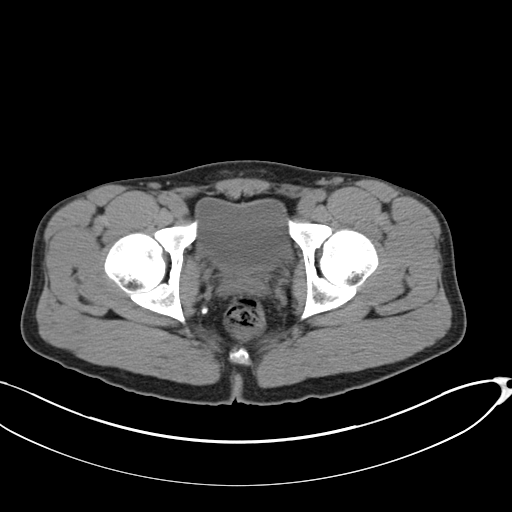
[im 25/94  soft-tissue]
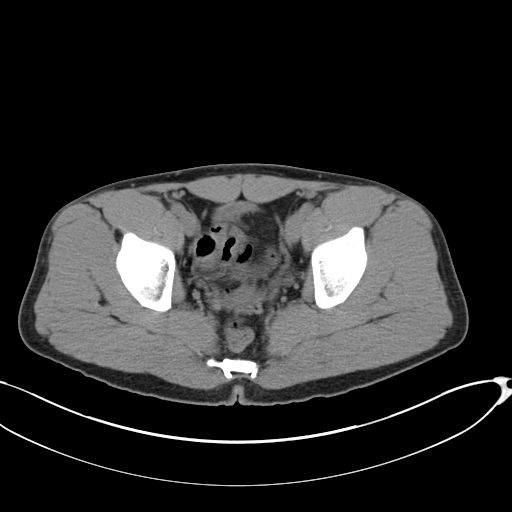
[im 35/94  soft-tissue]
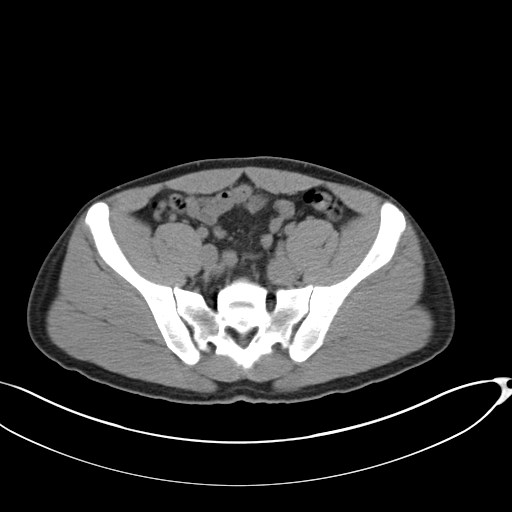
[im 40/94  soft-tissue]
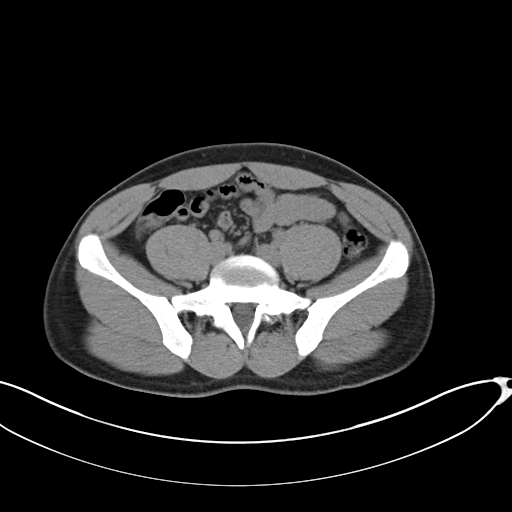
[im 49/94  soft-tissue]
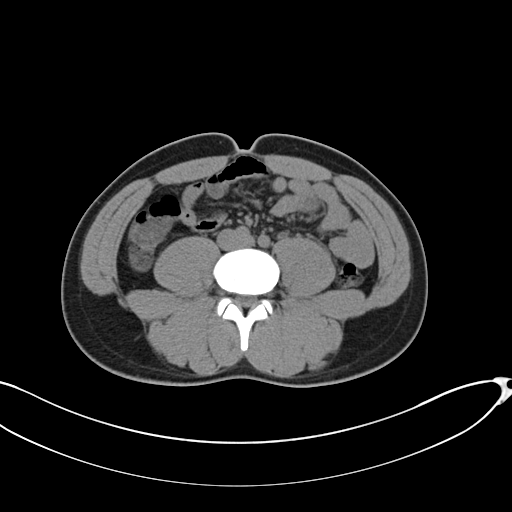
[im 54/94  soft-tissue]
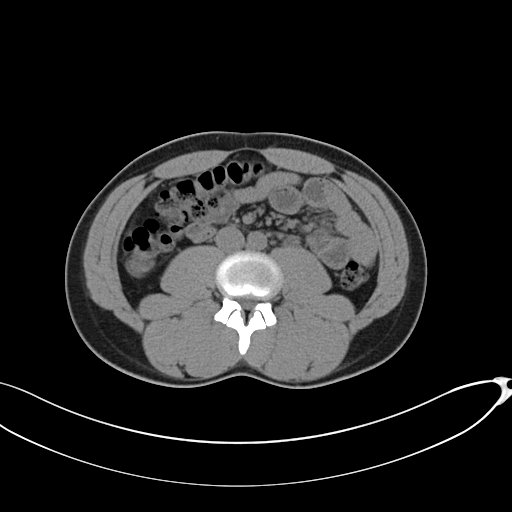
[im 59/94  soft-tissue]
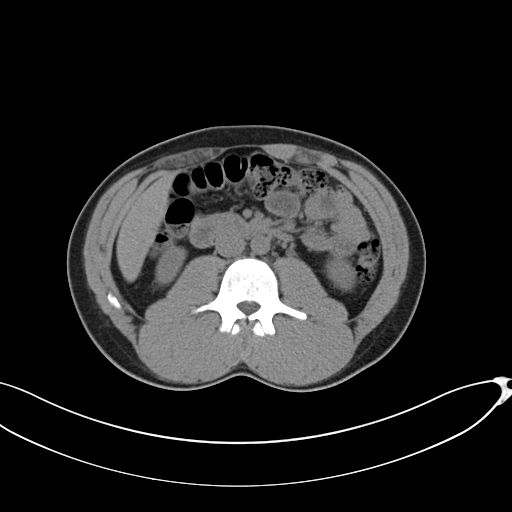
[im 59/94  bone]
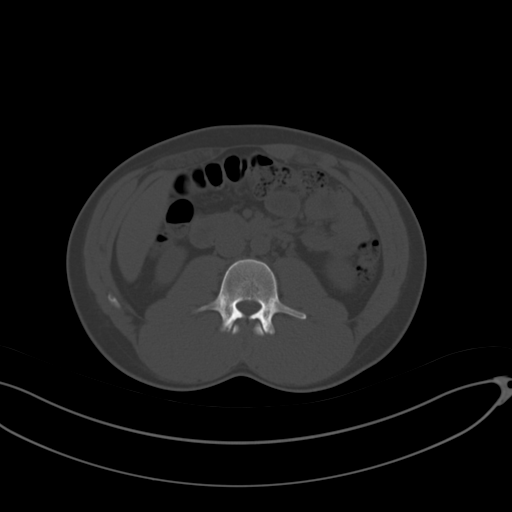
[im 69/94  soft-tissue]
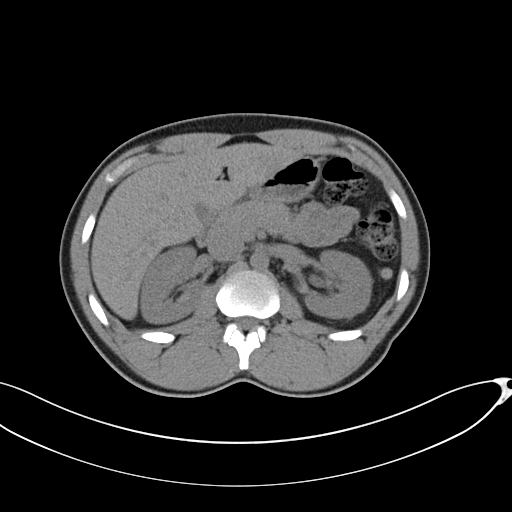
[im 74/94  soft-tissue]
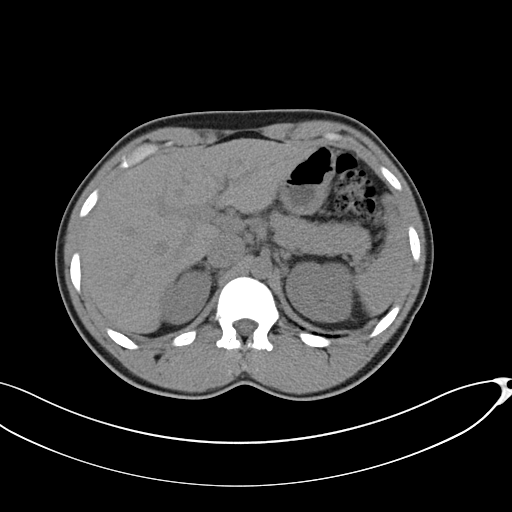
[im 79/94  soft-tissue]
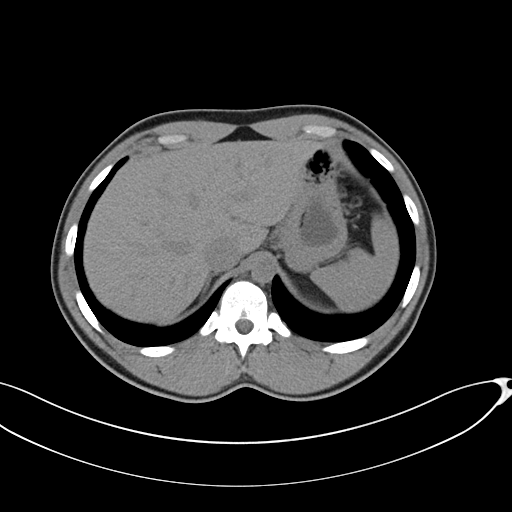
[im 89/94  soft-tissue]
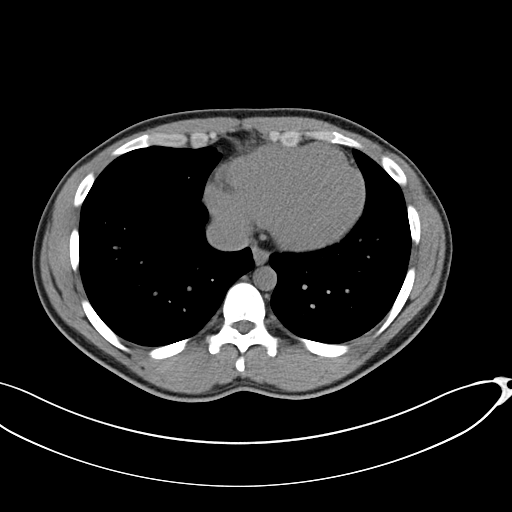

[Series 5: coronal · coronal · 0.72mm/px · 3 of 123 slices shown]
[im 41/123  soft-tissue]
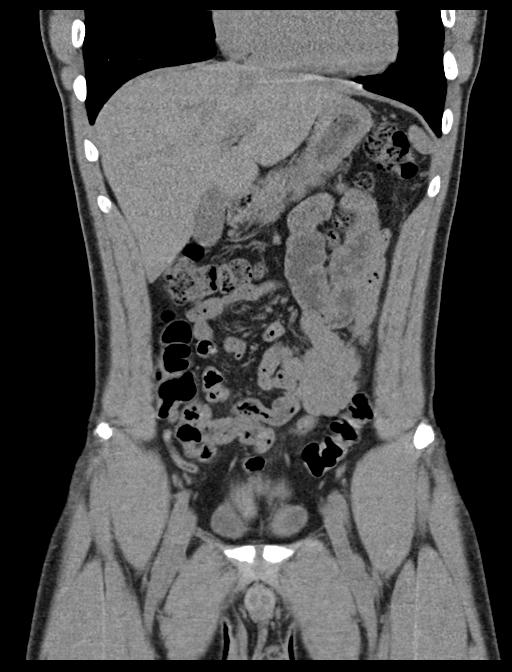
[im 55/123  soft-tissue]
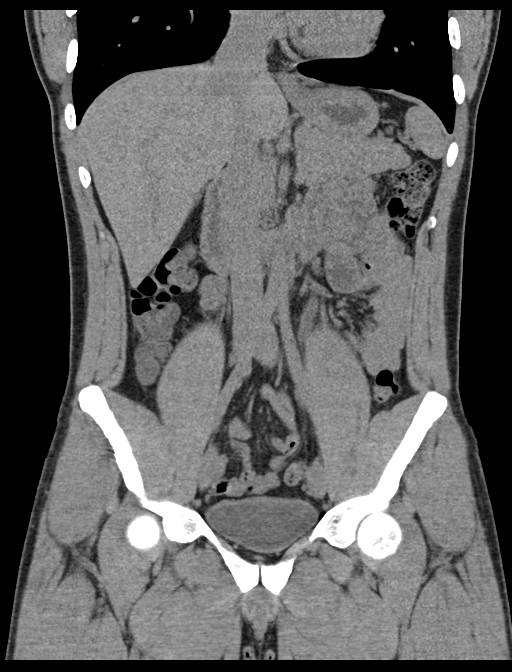
[im 68/123  soft-tissue]
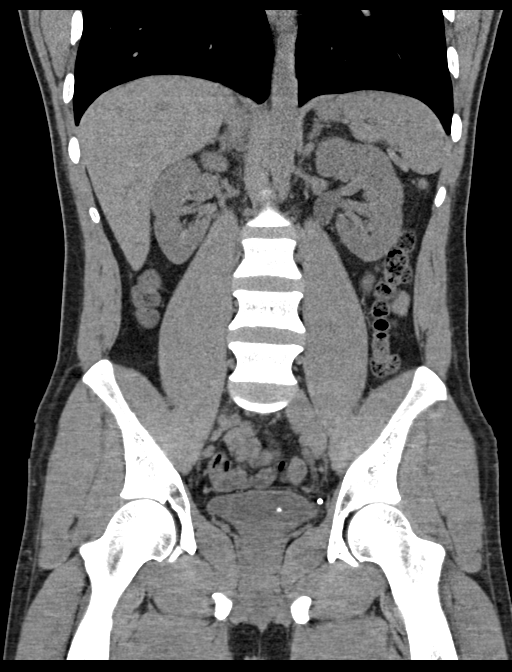

[16 of 46 positions shown; findings below may reference images not displayed]

FINDINGS: LOWER CHEST: Lung bases are clear. The visualized heart size is
normal. No pericardial effusion.

HEPATOBILIARY: Normal.

PANCREAS: Normal.

SPLEEN: Normal.

ADRENALS/URINARY TRACT: Kidneys are orthotopic, demonstrating normal
size and morphology. Mild LEFT hydroureteronephrosis. 3 mm depending
calculus LEFT bladder. 4 mm RIGHT lower pole nephrolithiasis.
Punctate bilateral nephrolithiasis. No hydronephrosis; limited
assessment for renal masses by nonenhanced CT. 2.2 cm homogeneously
hypodense benign-appearing cyst lower pole LEFT kidney. Urinary
bladder is partially distended and unremarkable. Normal adrenal
glands.

STOMACH/BOWEL: The stomach, small and large bowel are normal in
course and caliber without inflammatory changes, sensitivity
decreased by lack of enteric contrast. Mild sigmoid colonic
diverticulosis. Normal appendix.

VASCULAR/LYMPHATIC: Aortoiliac vessels are normal in course and
caliber. No lymphadenopathy by CT size criteria.

REPRODUCTIVE: Normal.

OTHER: No intraperitoneal free fluid or free air. Phleboliths LEFT
pelvis.

MUSCULOSKELETAL: Non-acute.
IMPRESSION: 1. 3 mm LEFT bladder calculus, likely recently passed with mild
residual hydroureteronephrosis.
2. Bilateral nephrolithiasis measuring to 4 mm on the RIGHT.

## 2019-03-01 ENCOUNTER — Telehealth: Payer: Self-pay

## 2019-03-01 MED ORDER — RIFAXIMIN 550 MG PO TABS: 550.0000 mg | ORAL_TABLET | Freq: Three times a day (TID) | ORAL | 0 refills | Status: DC

## 2019-03-01 NOTE — Telephone Encounter (Signed)
The pt has been advised and states he does continue to have abd discomfort.  I have sent in xifaxan to the pharmacy of his choice.  [facility] [address]  He will call if this is too expensive.

## 2019-03-01 NOTE — Telephone Encounter (Signed)
-----   Message from Loralie Champagne, PA-C sent at 03/01/2019  1:00 PM EDT ----- I just received results back from patient's SIBO testing that I ordered last July.  He just now had it performed.  The study suggests small intestinal bacterial overgrowth.  Basically this is an imbalance of the good and bad gut bacteria.  Please try to treat him with Xifaxan 550 mg 3 times daily for 2 weeks.  Please check with him first, but I am assuming he still having symptoms and that is why he went ahead and proceeded with the study.  Thank you,  Jess

## 2019-03-02 ENCOUNTER — Telehealth: Payer: Self-pay | Admitting: Gastroenterology

## 2019-03-03 NOTE — Telephone Encounter (Signed)
Pt called regarding this.  °

## 2019-03-04 NOTE — Telephone Encounter (Signed)
Only alternate is flagyl.  Although not preferable, we can try.  Flagyl 250 mg TID for 2 weeks.  Thank you,  Jess

## 2019-03-05 ENCOUNTER — Telehealth: Payer: Self-pay

## 2019-03-05 ENCOUNTER — Other Ambulatory Visit: Payer: Self-pay

## 2019-03-05 MED ORDER — METRONIDAZOLE 250 MG PO TABS: 250.0000 mg | ORAL_TABLET | Freq: Three times a day (TID) | ORAL | 0 refills | Status: AC

## 2019-03-19 ENCOUNTER — Telehealth: Payer: Self-pay | Admitting: Gastroenterology

## 2019-03-22 NOTE — Telephone Encounter (Signed)
The pt states he finished his xifaxan and has begun to feel better.  The bloating has resolved.  He does complain of constipation however.  I advised him to start 1 capful of miralax daily until he has a BM and titrate as needed.  He will call back to update [place] in a week or so, sooner if no bowel movement.

## 2019-03-26 NOTE — Telephone Encounter (Signed)
Created in error

## 2019-04-02 ENCOUNTER — Telehealth: Payer: Self-pay | Admitting: Gastroenterology

## 2019-04-02 NOTE — Telephone Encounter (Signed)
Pt stated that he has taken a breathing test and that his gut bacteria is out of balance.  Pt reported that he is still having GI issues and that Miralax has not helped him.

## 2019-04-02 NOTE — Telephone Encounter (Signed)
The pt has continued constipation despite miralax measures.  He was last seen by Alonza Bogus in 11/19.  He has not seen Dr Silverio Decamp in the office and prefers an appt to discuss.  Appt made for 10/13.

## 2019-05-11 ENCOUNTER — Encounter: Payer: Self-pay | Admitting: Gastroenterology

## 2019-05-11 ENCOUNTER — Other Ambulatory Visit (INDEPENDENT_AMBULATORY_CARE_PROVIDER_SITE_OTHER): Payer: 59

## 2019-05-11 ENCOUNTER — Other Ambulatory Visit: Payer: Self-pay

## 2019-05-11 ENCOUNTER — Ambulatory Visit (INDEPENDENT_AMBULATORY_CARE_PROVIDER_SITE_OTHER): Payer: Self-pay | Admitting: Gastroenterology

## 2019-05-11 VITALS — BP 122/90 | HR 72 | Temp 97.9°F | Ht 69.0 in | Wt 169.6 lb

## 2019-05-11 DIAGNOSIS — K5904 Chronic idiopathic constipation: Secondary | ICD-10-CM

## 2019-05-11 DIAGNOSIS — K588 Other irritable bowel syndrome: Secondary | ICD-10-CM

## 2019-05-11 DIAGNOSIS — K5909 Other constipation: Secondary | ICD-10-CM

## 2019-05-11 DIAGNOSIS — R140 Abdominal distension (gaseous): Secondary | ICD-10-CM

## 2019-05-11 DIAGNOSIS — F1010 Alcohol abuse, uncomplicated: Secondary | ICD-10-CM

## 2019-05-11 LAB — COMPREHENSIVE METABOLIC PANEL
ALT: 12 U/L (ref 0–53)
AST: 14 U/L (ref 0–37)
Albumin: 4.3 g/dL (ref 3.5–5.2)
Alkaline Phosphatase: 53 U/L (ref 39–117)
BUN: 10 mg/dL (ref 6–23)
CO2: 28 mEq/L (ref 19–32)
Calcium: 9.9 mg/dL (ref 8.4–10.5)
Chloride: 107 mEq/L (ref 96–112)
Creatinine, Ser: 1.14 mg/dL (ref 0.40–1.50)
GFR: 88.9 mL/min (ref >60.00)
Glucose, Bld: 99 mg/dL (ref 70–99)
Potassium: 4.7 mEq/L (ref 3.5–5.1)
Sodium: 141 mEq/L (ref 135–145)
Total Bilirubin: 1.4 mg/dL — ABNORMAL HIGH (ref 0.2–1.2)
Total Protein: 6.9 g/dL (ref 6.0–8.3)

## 2019-05-11 LAB — CBC WITH DIFFERENTIAL/PLATELET
Basophils Absolute: 0 10*3/uL (ref 0.0–0.1)
Basophils Relative: 0.3 % (ref 0.0–3.0)
Eosinophils Absolute: 0 10*3/uL (ref 0.0–0.7)
Eosinophils Relative: 0.7 % (ref 0.0–5.0)
HCT: 44.7 % (ref 39.0–52.0)
Hemoglobin: 14.9 g/dL (ref 13.0–17.0)
Lymphocytes Relative: 48.2 % — ABNORMAL HIGH (ref 12.0–46.0)
Lymphs Abs: 1.8 10*3/uL (ref 0.7–4.0)
MCHC: 33.2 g/dL (ref 30.0–36.0)
MCV: 88.8 fl (ref 78.0–100.0)
Monocytes Absolute: 0.3 10*3/uL (ref 0.1–1.0)
Monocytes Relative: 8.3 % (ref 3.0–12.0)
Neutro Abs: 1.6 10*3/uL (ref 1.4–7.7)
Neutrophils Relative %: 42.5 % — ABNORMAL LOW (ref 43.0–77.0)
Platelets: 235 10*3/uL (ref 150.0–400.0)
RBC: 5.04 Mil/uL (ref 4.22–5.81)
RDW: 13.3 % (ref 11.5–15.5)
WBC: 3.8 10*3/uL — ABNORMAL LOW (ref 4.0–10.5)

## 2019-05-11 LAB — TSH: TSH: 0.85 u[IU]/mL (ref 0.35–4.50)

## 2019-05-11 LAB — VITAMIN D 25 HYDROXY (VIT D DEFICIENCY, FRACTURES): VITD: 23.09 ng/mL — ABNORMAL LOW (ref 30.00–100.00)

## 2019-05-11 MED ORDER — LINACLOTIDE 72 MCG PO CAPS: 72.0000 ug | ORAL_CAPSULE | Freq: Every day | ORAL | 2 refills | Status: DC

## 2019-05-11 NOTE — Patient Instructions (Addendum)
Healthy Eating Following a healthy eating pattern may help you to achieve and maintain a healthy body weight, reduce the risk of chronic disease, and live a long and productive life. It is important to follow a healthy eating pattern at an appropriate calorie level for your body. Your nutritional needs should be met primarily through food by choosing a variety of nutrient-rich foods. What are tips for following this plan? Reading food labels  Read labels and choose the following: ? Reduced or low sodium. ? Juices with 100% fruit juice. ? Foods with low saturated fats and high polyunsaturated and monounsaturated fats. ? Foods with whole grains, such as whole wheat, cracked wheat, brown rice, and wild rice. ? Whole grains that are fortified with folic acid. This is recommended for women who are pregnant or who want to become pregnant.  Read labels and avoid the following: ? Foods with a lot of added sugars. These include foods that contain brown sugar, corn sweetener, corn syrup, dextrose, fructose, glucose, high-fructose corn syrup, honey, invert sugar, lactose, malt syrup, maltose, molasses, raw sugar, sucrose, trehalose, or turbinado sugar.  Do not eat more than the following amounts of added sugar per day:  6 teaspoons (25 g) for women.  9 teaspoons (38 g) for men. ? Foods that contain processed or refined starches and grains. ? Refined grain products, such as white flour, degermed cornmeal, white bread, and white rice. Shopping  Choose nutrient-rich snacks, such as vegetables, whole fruits, and nuts. Avoid high-calorie and high-sugar snacks, such as potato chips, fruit snacks, and candy.  Use oil-based dressings and spreads on foods instead of solid fats such as butter, stick margarine, or cream cheese.  Limit pre-made sauces, mixes, and "instant" products such as flavored rice, instant noodles, and ready-made pasta.  Try more plant-protein sources, such as tofu, tempeh, black beans,  edamame, lentils, nuts, and seeds.  Explore eating plans such as the Mediterranean diet or vegetarian diet. Cooking  Use oil to saut or stir-fry foods instead of solid fats such as butter, stick margarine, or lard.  Try baking, boiling, grilling, or broiling instead of frying.  Remove the fatty part of meats before cooking.  Steam vegetables in water or broth. Meal planning   At meals, imagine dividing your plate into fourths: ? One-half of your plate is fruits and vegetables. ? One-fourth of your plate is whole grains. ? One-fourth of your plate is protein, especially lean meats, poultry, eggs, tofu, beans, or nuts.  Include low-fat dairy as part of your daily diet. Lifestyle  Choose healthy options in all settings, including home, work, school, restaurants, or stores.  Prepare your food safely: ? Wash your hands after handling raw meats. ? Keep food preparation surfaces clean by regularly washing with hot, soapy water. ? Keep raw meats separate from ready-to-eat foods, such as fruits and vegetables. ? Cook seafood, meat, poultry, and eggs to the recommended internal temperature. ? Store foods at safe temperatures. In general:  Keep cold foods at 59F (4.4C) or below.  Keep hot foods at 159F (60C) or above.  Keep your freezer at [facility] (-17.8C) or below.  Foods are no longer safe to eat when they have been between the temperatures of 40-159F (4.4-60C) for more than 2 hours. What foods should I eat? Fruits Aim to eat 2 cup-equivalents of fresh, canned (in natural juice), or frozen fruits each day. Examples of 1 cup-equivalent of fruit include 1 small apple, 8 large strawberries, 1 cup canned fruit,  cup  dried fruit, or 1 cup 100% juice. Vegetables Aim to eat 2-3 cup-equivalents of fresh and frozen vegetables each day, including different varieties and colors. Examples of 1 cup-equivalent of vegetables include 2 medium carrots, 2 cups raw, leafy greens, 1 cup chopped  vegetable (raw or cooked), or 1 medium baked potato. Grains Aim to eat 6 ounce-equivalents of whole grains each day. Examples of 1 ounce-equivalent of grains include 1 slice of bread, 1 cup ready-to-eat cereal, 3 cups popcorn, or  cup cooked rice, pasta, or cereal. Meats and other proteins Aim to eat 5-6 ounce-equivalents of protein each day. Examples of 1 ounce-equivalent of protein include 1 egg, 1/2 cup nuts or seeds, or 1 tablespoon (16 g) peanut butter. A cut of meat or fish that is the size of a deck of cards is about 3-4 ounce-equivalents.  Of the protein you eat each week, try to have at least 8 ounces come from seafood. This includes salmon, trout, herring, and anchovies. Dairy Aim to eat 3 cup-equivalents of fat-free or low-fat dairy each day. Examples of 1 cup-equivalent of dairy include 1 cup (240 mL) milk, 8 ounces (250 g) yogurt, 1 ounces (44 g) natural cheese, or 1 cup (240 mL) fortified soy milk. Fats and oils  Aim for about 5 teaspoons (21 g) per day. Choose monounsaturated fats, such as canola and olive oils, avocados, peanut butter, and most nuts, or polyunsaturated fats, such as sunflower, corn, and soybean oils, walnuts, pine nuts, sesame seeds, sunflower seeds, and flaxseed. Beverages  Aim for six 8-oz glasses of water per day. Limit coffee to three to five 8-oz cups per day.  Limit caffeinated beverages that have added calories, such as soda and energy drinks.  Limit alcohol intake to no more than 1 drink a day for nonpregnant women and 2 drinks a day for men. One drink equals 12 oz of beer (355 mL), 5 oz of wine (148 mL), or 1 oz of hard liquor (44 mL). Seasoning and other foods  Avoid adding excess amounts of salt to your foods. Try flavoring foods with herbs and spices instead of salt.  Avoid adding sugar to foods.  Try using oil-based dressings, sauces, and spreads instead of solid fats. This information is based on general [place] nutrition guidelines. For more  information, visit [employer]. Exact amounts may vary based on your nutrition needs. Summary  A healthy eating plan may help you to maintain a healthy weight, reduce the risk of chronic diseases, and stay active throughout your life.  Plan your meals. Make sure you eat the right portions of a variety of nutrient-rich foods.  Try baking, boiling, grilling, or broiling instead of frying.  Choose healthy options in all settings, including home, work, school, restaurants, or stores. This information is not intended to replace advice given to you by your health care provider. Make sure you discuss any questions you have with your health care provider. Document Released: 10/27/2017 Document Revised: 10/27/2017 Document Reviewed: 10/27/2017 Elsevier Patient Education  [place].   Constipation, Adult Constipation is when a person:  Poops (has a bowel movement) fewer times in a week than normal.  Has a hard time pooping.  Has poop that is dry, hard, or bigger than normal. Follow these instructions at home: Eating and drinking   Eat foods that have a lot of fiber, such as: ? Fresh fruits and vegetables. ? Whole grains. ? Beans.  Eat less of foods that are high in fat, low in fiber, or  overly processed, such as: ? [place] fries. ? Hamburgers. ? Cookies. ? Candy. ? Soda.  Drink enough fluid to keep your pee (urine) clear or pale yellow. General instructions  Exercise regularly or as told by your doctor.  Go to the restroom when you feel like you need to poop. Do not hold it in.  Take over-the-counter and prescription medicines only as told by your doctor. These include any fiber supplements.  Do pelvic floor retraining exercises, such as: ? Doing deep breathing while relaxing your lower belly (abdomen). ? Relaxing your pelvic floor while pooping.  Watch your condition for any changes.  Keep all follow-up visits as told by your doctor. This is important.  Contact a doctor if:  You have pain that gets worse.  You have a fever.  You have not pooped for 4 days.  You throw up (vomit).  You are not hungry.  You lose weight.  You are bleeding from the anus.  You have thin, pencil-like poop (stool). Get help right away if:  You have a fever, and your symptoms suddenly get worse.  You leak poop or have blood in your poop.  Your belly feels hard or bigger than normal (is bloated).  You have very bad belly pain.  You feel dizzy or you faint. This information is not intended to replace advice given to you by your health care provider. Make sure you discuss any questions you have with your health care provider. Document Released: 01/01/2008 Document Revised: 06/27/2017 Document Reviewed: 01/03/2016 Elsevier Patient Education  [place] to the basement for labs today  We have sent Linzess to your pharmacy  Take Benefiber/Fiberchoice 1 tablet 2-3 times daily with meals  Increase water intake 60-80 oz daily  Eat small frequent meals with fruits,vegetables,and whole grains  Follow up in 4 months  I appreciate the  opportunity to care for you  Thank You   Harl Bowie , MD

## 2019-05-11 NOTE — Progress Notes (Signed)
Gary Montgomery    [id number]    08-12-1985  Primary Care Physician:No primary care provider on file.  Referring Physician: [name], [name], NP No address on file   Chief complaint:  Constipation  HPI:  [age] yr M with c/o constipation and bloating. Last seen by [name] 12/2017  CT scan 04/2017 and July 2019: Showed bilateral nephrolithiasis with mild hydroureteronephrosis otherwise unremarkable stomach, small and large bowel.  Mild colonic diverticulosis.  Abdominal ultrasound June 2019: Normal gallbladder, small hemangioma in posterior right lobe 1.2 cm otherwise unremarkable exam.  TTG IgA antibody less than 1, negative for celiac disease  Breat test: positive s/p Rifaximin course for 2 weeks end of August. He feels better no longer has significant abdominal bloating or fullness He has a BM almost every day but does not feel he has completely evacuated.  Reports decreased appetite, on average he eats only 1 meal a day.  Drinks about 30 to 40 ounces of water per day.  He is taking fiber Gummies as needed.  And also uses over-the-counter laxatives as needed.  He quit EtOH 4 months ago, was drinking heavily last year.  He replaced alcohol with candy and is snacking on M&Ms throughout the day.  Denies any nausea, vomiting, abdominal pain, melena or bright red blood per rectum  No family history of colon cancer or GI malignancy.  No history of Crohn's disease or IBD.     Outpatient Encounter Medications as of 05/11/2019  Medication Sig  . HYDROcodone-acetaminophen (NORCO) 5-325 MG tablet Take 1 tablet by mouth every 6 (six) hours as needed for severe pain.  . naproxen (NAPROSYN) 500 MG tablet Take 1 tablet (500 mg total) by mouth 2 (two) times daily as needed for mild pain, moderate pain or headache (TAKE WITH MEALS.).  Marland Kitchen. tamsulosin (FLOMAX) 0.4 MG CAPS capsule Take 1 capsule (0.4 mg total) by mouth daily after supper. Take until the stone passes, then stop  taking  . [DISCONTINUED] ondansetron (ZOFRAN ODT) 4 MG disintegrating tablet Take 1 tablet (4 mg total) by mouth every 8 (eight) hours as needed for nausea or vomiting.   No facility-administered encounter medications on file as of 05/11/2019.     Allergies as of 05/11/2019  . (No Known Allergies)    Past Medical History:  Diagnosis Date  . Alcoholism (HCC)   . Kidney stones     Past Surgical History:  Procedure Laterality Date  . DENTAL SURGERY      Family History  Problem Relation Age of Onset  . Hypertension Mother   . Hypertension Father   . Arthritis Maternal Grandmother   . Hypertension Maternal Grandmother   . Heart attack Maternal Grandmother   . Hypertension Maternal Grandfather   . Hypertension Paternal Grandmother   . Hypertension Paternal Grandfather     Social History   Socioeconomic History  . Marital status: Single    Spouse name: Not on file  . Number of children: 0  . Years of education: 8214  . Highest education level: Not on file  Occupational History  . Occupation: Apartment Maintenance  Social Needs  . Financial resource strain: Not on file  . Food insecurity    Worry: Not on file    Inability: Not on file  . Transportation needs    Medical: Not on file    Non-medical: Not on file  Tobacco Use  . Smoking status: Former Smoker    Packs/day:  0.50    Years: 9.00    Pack years: 4.50    Quit date: 12/08/2009    Years since quitting: 9.4  . Smokeless tobacco: Never Used  Substance and Sexual Activity  . Alcohol use: Yes    Alcohol/week: 21.0 standard drinks    Types: 21 Cans of beer per week    Comment: 1 beer socially  . Drug use: No  . Sexual activity: Not on file  Lifestyle  . Physical activity    Days per week: Not on file    Minutes per session: Not on file  . Stress: Not on file  Relationships  . Social Herbalist on phone: Not on file    Gets together: Not on file    Attends religious service: Not on file     Active member of club or organization: Not on file    Attends meetings of clubs or organizations: Not on file    Relationship status: Not on file  . Intimate partner violence    Fear of current or ex partner: Not on file    Emotionally abused: Not on file    Physically abused: Not on file    Forced sexual activity: Not on file  Other Topics Concern  . Not on file  Social History Narrative   Fun: Eat, hang out with friends, travel   Denies religious beliefs effecting health care.       Review of systems: Review of Systems  Constitutional: Negative for fever and chills.  Positive for lack of energy HENT: Negative.   Eyes: Negative for blurred vision.  Respiratory: Negative for cough, shortness of breath and wheezing.   Cardiovascular: Negative for chest pain and palpitations.  Gastrointestinal: as per HPI Genitourinary: Negative for dysuria, urgency, frequency and hematuria.  Musculoskeletal: Negative for myalgias, back pain and joint pain.  Skin: Negative for itching and rash.  Neurological: Negative for dizziness, tremors, focal weakness, seizures and loss of consciousness.  Endo/Heme/Allergies: Negative Psychiatric/Behavioral: Negative for depression, suicidal ideas and hallucinations.  All other systems reviewed and are negative.   Physical Exam: Vitals:   05/11/19 0815  BP: 122/90  Pulse: 72  Temp: 97.9 F (36.6 C)   Body mass index is 25.05 kg/m. Gen:      No acute distress HEENT:  EOMI, sclera anicteric Neck:     No masses; no thyromegaly Lungs:    Clear to auscultation bilaterally; normal respiratory effort CV:         Regular rate and rhythm; no murmurs Abd:      + bowel sounds; soft, non-tender; no palpable masses, no distension Ext:    No edema; adequate peripheral perfusion Skin:      Warm and dry; no rash Neuro: alert and oriented x 3 Psych: normal mood and affect  Data Reviewed:  Reviewed labs, radiology imaging, old records and pertinent past GI  work up   Assessment and Plan/Recommendations:  33 year old male with chronic constipation with complaints of abdominal fullness, bloating and decreased appetite  Start Linzess 72 mcg daily Increase dietary fiber and water intake to 60 to 80 ounces daily Start fiber supplements Benefiber/Fiberchoice 1 tablet 2-3 times daily with meals Discussed in detail healthy dietary habits, small frequent meals with fruits, vegetables and whole grains  Check CBC, CMP, TSH and vitamin D level to exclude any other etiology for worsening constipation  We will hold off colonoscopy given he has no alarming symptoms, no family history and recent imaging  negative for any mass lesion  Continue abstinence from alcohol  Return in 3 to 4 months or sooner if needed  25 minutes was spent face-to-face with the patient. Greater than 50% of the time used for counseling as well as treatment plan and follow-up.  He had multiple questions which were answered to his satisfaction  K. Scherry Ran , MD    CC: Evaristo Bury, NP

## 2019-05-13 ENCOUNTER — Telehealth: Payer: Self-pay | Admitting: *Deleted

## 2019-05-13 MED ORDER — TRULANCE 3 MG PO TABS: 3.0000 mg | ORAL_TABLET | Freq: Every day | ORAL | 1 refills | Status: DC

## 2019-05-13 NOTE — Telephone Encounter (Signed)
Called patient and informed him that Trulance is covered by his insurance and iv sent that into his pharmacy   He will try that

## 2019-05-13 NOTE — Telephone Encounter (Signed)
Dr Geoffry Paradise is not covered unless the patient has tried and failed Trulance or Amitiza   Which do you want to prescribe   The Trulance is preferred

## 2019-05-13 NOTE — Telephone Encounter (Signed)
Ok please send Rx for Trulance 3mg  daily, advise him to stop it if he develops diarrhea. May be insurance will cover Linzess then. Thanks

## 2019-05-13 NOTE — Telephone Encounter (Signed)
Trulance approved until 05/12/2020   Sent approval to be scanned in

## 2019-05-13 NOTE — Telephone Encounter (Signed)
We have also revieved a prior auth request for Trulance even though insurance said it was covered with No prior auth    Resubmitted prior auth for Trulance  Through Cover My Meds

## 2019-05-14 ENCOUNTER — Telehealth: Payer: Self-pay | Admitting: Gastroenterology

## 2019-05-14 ENCOUNTER — Other Ambulatory Visit: Payer: Self-pay

## 2019-05-14 NOTE — Telephone Encounter (Signed)
I called and spoke to Gary Montgomery and he said the one month supply Robin sent in yesterday is going to run $264. I told him to try [place] or call his insurance company and see what choices they cover that are less expensive and call to let us know so we can ask Dr Silverio Decamp which one would be best for him.

## 2019-05-20 ENCOUNTER — Telehealth: Payer: Self-pay | Admitting: Gastroenterology

## 2019-05-20 MED ORDER — LINACLOTIDE 72 MCG PO CAPS: 72.0000 ug | ORAL_CAPSULE | Freq: Every day | ORAL | 3 refills | Status: DC

## 2019-05-20 NOTE — Telephone Encounter (Signed)
Pt inquired about Linzess and would like to know whether it would benefit him to be on this medication.

## 2019-05-20 NOTE — Telephone Encounter (Signed)
Patient could not afford Trulance will come up tomorrow and pick up Linzess 72 mcg to try and I will send in a new rx and do another prior auth for the Linzess this may wind up being cheaper

## 2019-05-21 NOTE — Telephone Encounter (Signed)
Done prior auth today for Linzess with Cover My Meds

## 2019-05-21 NOTE — Telephone Encounter (Signed)
Linzess approved Ref # [id number]

## 2019-09-27 ENCOUNTER — Encounter: Payer: Self-pay | Admitting: Gastroenterology

## 2019-09-27 ENCOUNTER — Other Ambulatory Visit: Payer: Self-pay

## 2019-09-27 ENCOUNTER — Ambulatory Visit (INDEPENDENT_AMBULATORY_CARE_PROVIDER_SITE_OTHER): Payer: 59 | Admitting: Gastroenterology

## 2019-09-27 VITALS — BP 116/90 | HR 76 | Temp 98.6°F | Ht 69.5 in | Wt 180.2 lb

## 2019-09-27 DIAGNOSIS — K5904 Chronic idiopathic constipation: Secondary | ICD-10-CM

## 2019-09-27 DIAGNOSIS — R140 Abdominal distension (gaseous): Secondary | ICD-10-CM | POA: Diagnosis not present

## 2019-09-27 NOTE — Patient Instructions (Signed)
Continue Fiber Choice daily as needed  Continue Miralax 1 capful daily and titrate up or down as needed  I appreciate the  opportunity to care for you  Thank You   Marsa Aris , MD

## 2019-09-27 NOTE — Progress Notes (Signed)
Gary Montgomery    [id number]    [birth year]  Primary Care Physician:Patient, No Pcp Per  Referring Physician: No referring provider defined for this encounter.   Chief complaint: Constipation, early satiety HPI: 34 year old male here for follow-up visit for chronic constipation He is taking MiraLAX daily, doesn't usually measure, pulsatile directly from the bottle. He thinks he takes about a capful daily and is having one bowel movement daily on average. He continues to have sensation of incomplete evacuation and bloating. He is taking Fiberchoice tablets one daily and also takes over-the-counter supplements with green tea and vitamins  No rectal bleeding or melena. No abdominal pain, nausea, vomiting or weight loss. Decreased appetite when he feels bloated and also has early satiety especially if he hasn't completely evacuated.   CT scan 04/2017 and July 2019: Showed bilateral nephrolithiasis with mild hydroureteronephrosis otherwise unremarkable stomach, small and large bowel.  Mild colonic diverticulosis.  Abdominal ultrasound June 2019: Normal gallbladder, small hemangioma in posterior right lobe 1.2 cm otherwise unremarkable exam.  TTG IgA antibody less than 1, negative for celiac disease  Breath test: positive s/p Rifaximin course for 2 weeks end of August.   Family history negative for colon cancer or GI malignancy  He is planning to get married in May of this year and is moving to [place], [place].  Outpatient Encounter Medications as of 09/27/2019  Medication Sig  . bisacodyl (DULCOLAX) 5 MG EC tablet Take 2 mg by mouth as needed for moderate constipation.  . docusate sodium (STOOL SOFTENER) 100 MG capsule Take 100 mg by mouth as needed for mild constipation.  . polyethylene glycol powder (GLYCOLAX/MIRALAX) 17 GM/SCOOP powder Take 1 Container by mouth once.  . [DISCONTINUED] linaclotide (LINZESS) 72 MCG capsule Take 1 capsule (72 mcg total) by mouth  daily before breakfast.  . [DISCONTINUED] naproxen (NAPROSYN) 500 MG tablet Take 1 tablet (500 mg total) by mouth 2 (two) times daily as needed for mild pain, moderate pain or headache (TAKE WITH MEALS.). (Patient not taking: Reported on 05/11/2019)  . [DISCONTINUED] Plecanatide (TRULANCE) 3 MG TABS Take 3 mg by mouth daily.   No facility-administered encounter medications on file as of 09/27/2019.    Allergies as of 09/27/2019  . (No Known Allergies)    Past Medical History:  Diagnosis Date  . Alcoholism (HCC)   . Kidney stones     Past Surgical History:  Procedure Laterality Date  . DENTAL SURGERY      Family History  Problem Relation Age of Onset  . Hypertension Mother   . Hypertension Father   . Arthritis Maternal Grandmother   . Hypertension Maternal Grandmother   . Heart attack Maternal Grandmother   . Hypertension Maternal Grandfather   . Hypertension Paternal Grandmother   . Hypertension Paternal Grandfather     Social History   Socioeconomic History  . Marital status: Single    Spouse name: Not on file  . Number of children: 0  . Years of education: 75  . Highest education level: Not on file  Occupational History  . Occupation: Apartment Maintenance  Tobacco Use  . Smoking status: Former Smoker    Packs/day: 0.50    Years: 9.00    Pack years: 4.50    Quit date: 12/08/2009    Years since quitting: 9.8  . Smokeless tobacco: Never Used  Substance and Sexual Activity  . Alcohol use: Yes    Alcohol/week: 21.0 standard drinks  Types: 21 Cans of beer per week    Comment: 1 beer socially  . Drug use: No  . Sexual activity: Not on file  Other Topics Concern  . Not on file  Social History Narrative   Fun: Eat, hang out with friends, travel   Denies religious beliefs effecting health care.    Social Determinants of Health   Financial Resource Strain:   . Difficulty of Paying Living Expenses: Not on file  Food Insecurity:   . Worried About Ship broker in the Last Year: Not on file  . Ran Out of Food in the Last Year: Not on file  Transportation Needs:   . Lack of Transportation (Medical): Not on file  . Lack of Transportation (Non-Medical): Not on file  Physical Activity:   . Days of Exercise per Week: Not on file  . Minutes of Exercise per Session: Not on file  Stress:   . Feeling of Stress : Not on file  Social Connections:   . Frequency of Communication with Friends and Family: Not on file  . Frequency of Social Gatherings with Friends and Family: Not on file  . Attends Religious Services: Not on file  . Active Member of Clubs or Organizations: Not on file  . Attends Archivist Meetings: Not on file  . Marital Status: Not on file  Intimate Partner Violence:   . Fear of Current or Ex-Partner: Not on file  . Emotionally Abused: Not on file  . Physically Abused: Not on file  . Sexually Abused: Not on file      Review of systems:  All other review of systems negative except as mentioned in the HPI.   Physical Exam: Vitals:   09/27/19 0931  BP: 116/90  Pulse: 76  Temp: 98.6 F (37 C)   Body mass index is 26.24 kg/m. Gen:      No acute distress Abd:      + bowel sounds; soft, non-tender; no palpable masses, no distension Neuro: alert and oriented x 3 Psych: normal mood and affect  Data Reviewed:  Reviewed labs, radiology imaging, old records and pertinent past GI work up   Assessment and Plan/Recommendations:  34 year old male with chronic idiopathic constipation and abdominal bloating  Continue MiraLAX one capful daily, titrate the dose up or down based on response to have 1-2 soft bowel movements daily  Continue fiber supplements, Fiberchoice 1 tablet daily  Increase dietary fiber and water intake to 60 to 80 ounces per day  Advised patient to [place], thereby will not schedule a follow-up visit here  This visit required 30 minutes of  patient care (this includes precharting, chart review, review of results, face-to-face time used for counseling as well as treatment plan and follow-up. The patient was provided an opportunity to ask questions and all were answered. The patient agreed with the plan and demonstrated an understanding of the instructions.  Damaris Hippo , MD    CC: No ref. provider found

## 2020-01-12 IMAGING — US US ABDOMEN COMPLETE
1 series · 13 of 25 positions shown · non-contrast
Comparison: CT abdomen pelvis of 05/24/2017

CLINICAL DATA: Abdominal bloating for several years

EXAM:
ABDOMEN ULTRASOUND COMPLETE

[Series 1: us abdomen complete · 0.23mm/px · 13 of 90 slices shown]
[im 1/90]
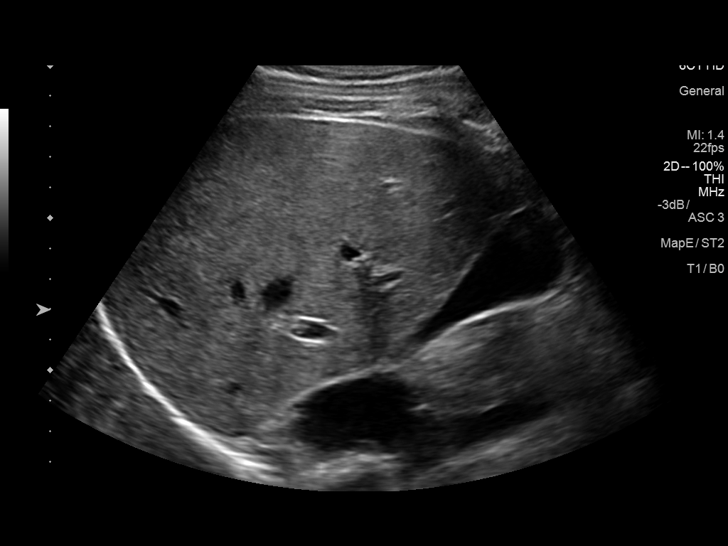
[im 8/90]
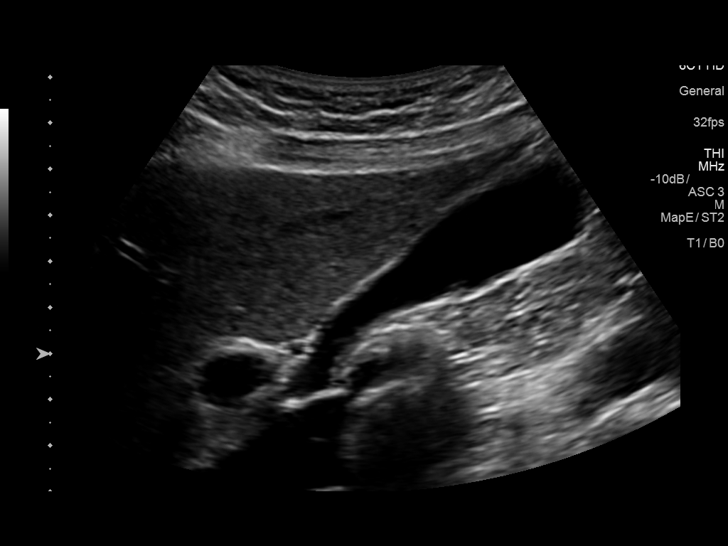
[im 15/90]
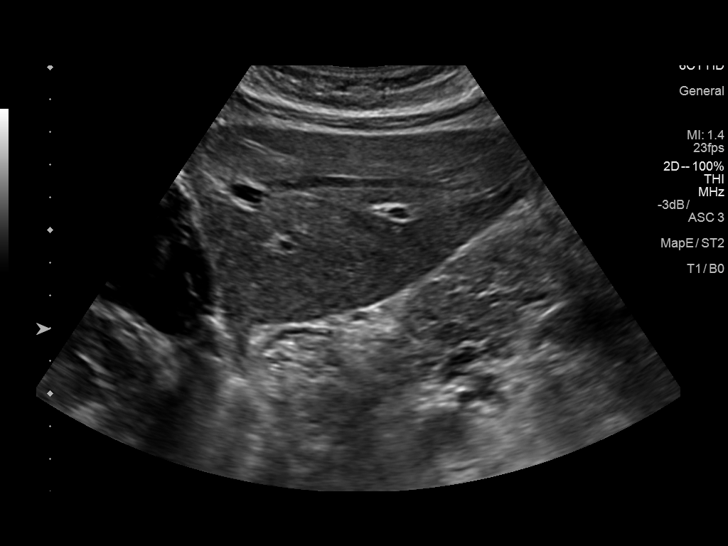
[im 23/90]
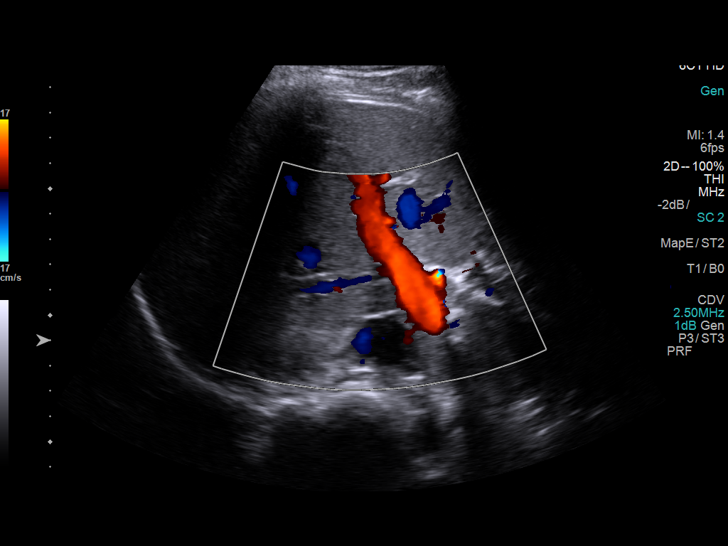
[im 30/90]
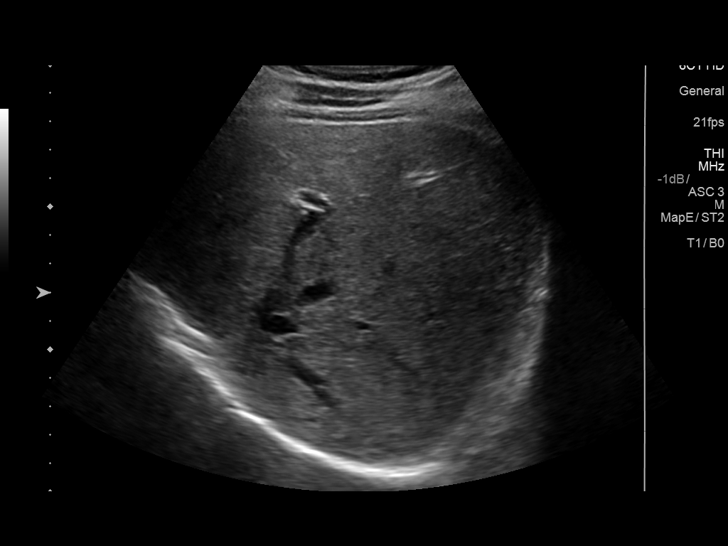
[im 38/90]
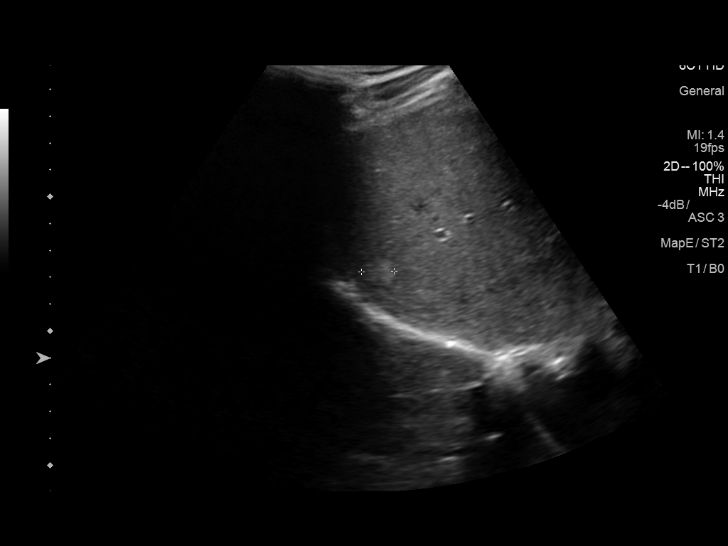
[im 45/90]
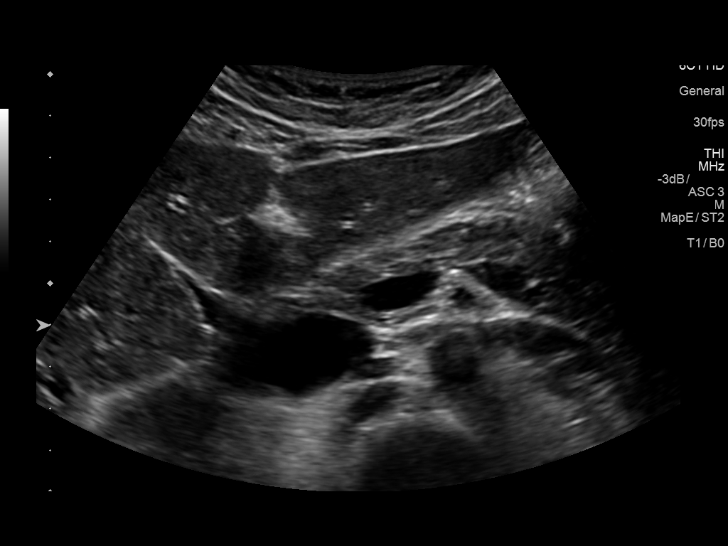
[im 52/90]
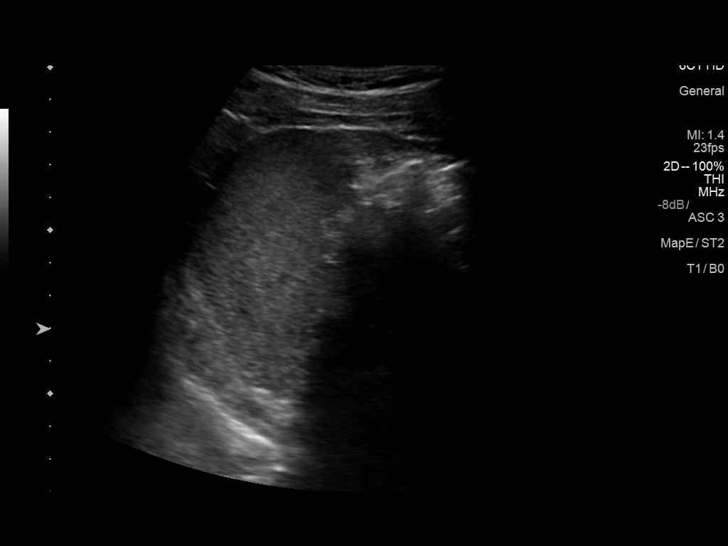
[im 60/90]
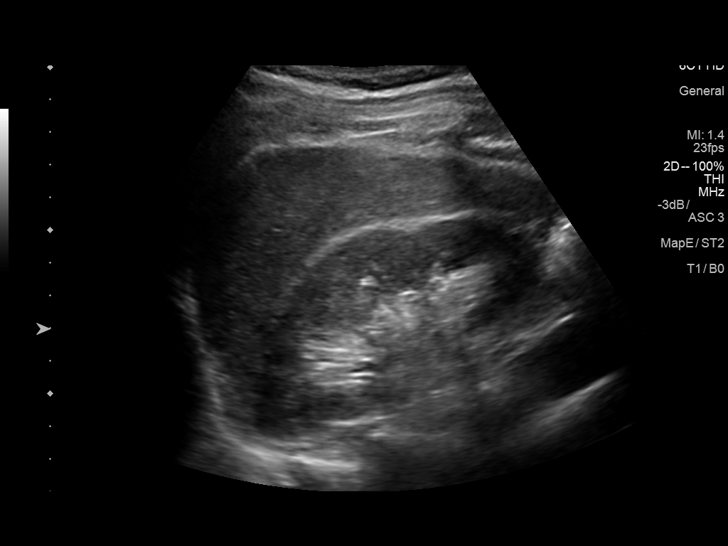
[im 67/90]
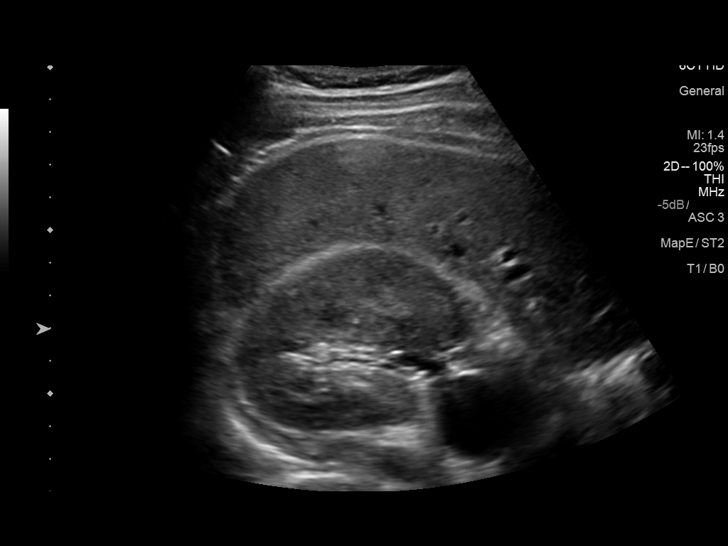
[im 75/90]
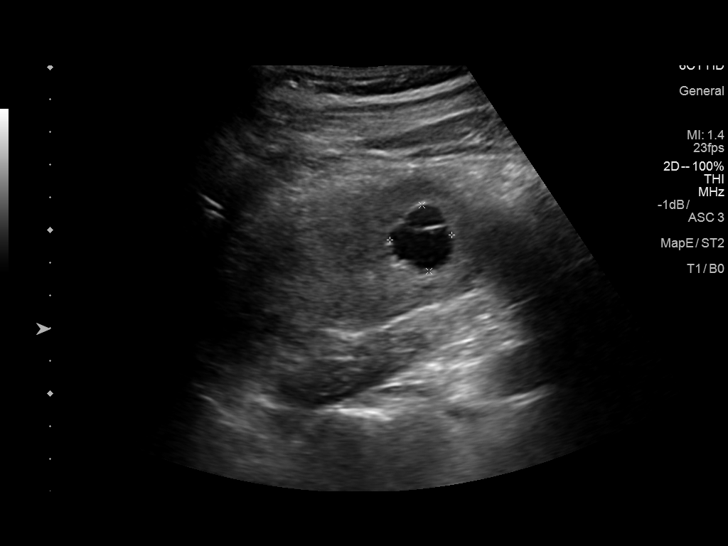
[im 82/90]
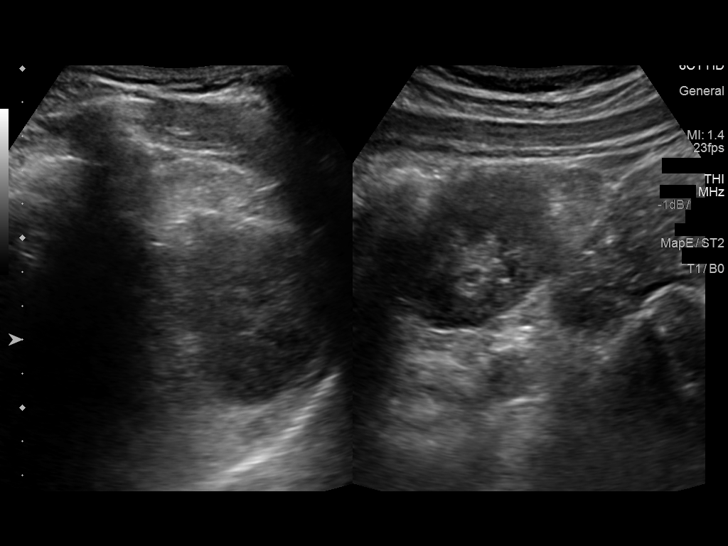
[im 90/90]
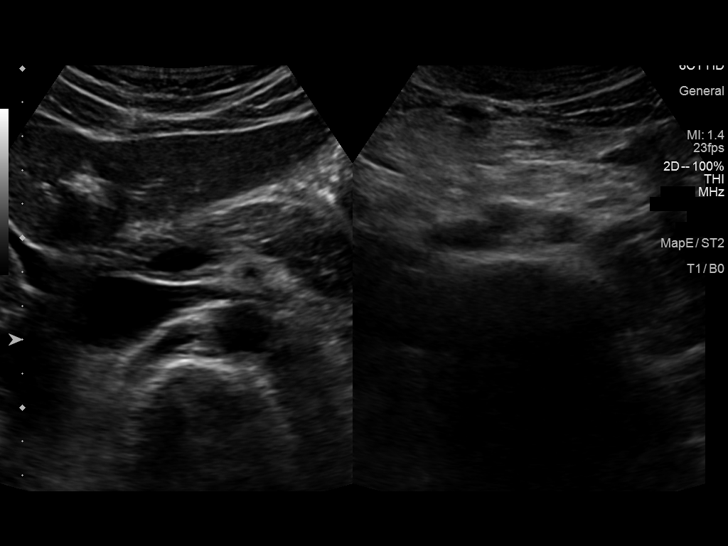

[13 of 25 positions shown; findings below may reference images not displayed]

FINDINGS: Gallbladder: The gallbladder is visualized and no gallstones are
seen. There is no pain over the gallbladder with compression.

Common bile duct: Diameter: The common bile duct is normal measuring
2.6 mm in diameter.

Liver: The liver has a normal echogenic pattern. There is and
echogenic focus posteriorly in the right lobe 1.2 cm in diameter
consistent with hemangioma. No other abnormality is seen. Portal
vein is patent on color Doppler imaging with normal direction of
blood flow towards the liver.

IVC: No abnormality visualized.

Pancreas: The pancreas is moderately well visualized with no
abnormality noted.

Spleen: The spleen is normal measuring 6.3 cm.

Right Kidney: Length: 10.5 cm..  No hydronephrosis is seen.

Left Kidney: Length: 11.4 cm.. No hydronephrosis is noted. A
septated cyst is noted of 2.0 cm.

Abdominal aorta: The abdominal aorta is normal in caliber.

Other findings: None.
IMPRESSION: 1. No gallstones.  No ductal dilatation.
2. Probable small hemangioma in the posterior right lobe of liver of
1.2 cm.
3. No hydronephrosis.  Septated cyst in the left kidney of 2.0 cm.

## 2020-07-29 IMAGING — US US ABDOMEN COMPLETE
1 series · 13 of 25 positions shown · non-contrast
Comparison: CT Abdomen and Pelvis 02/10/2018 and 05/24/2017.
Abdomen ultrasound 01/13/2018.

CLINICAL DATA: 32-year-old male with history of right lobe liver
hemangioma and left renal cyst.

EXAM:
ABDOMEN ULTRASOUND COMPLETE

[Series 1: us abdomen complete · 0.25mm/px · 13 of 95 slices shown]
[im 1/95]
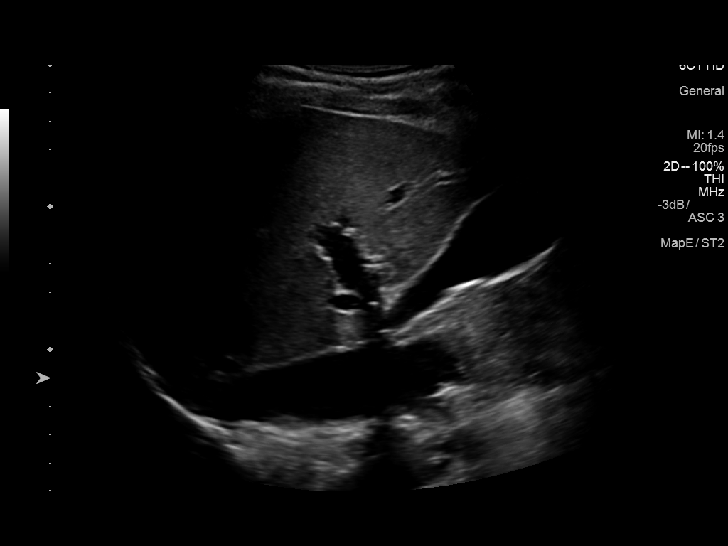
[im 8/95]
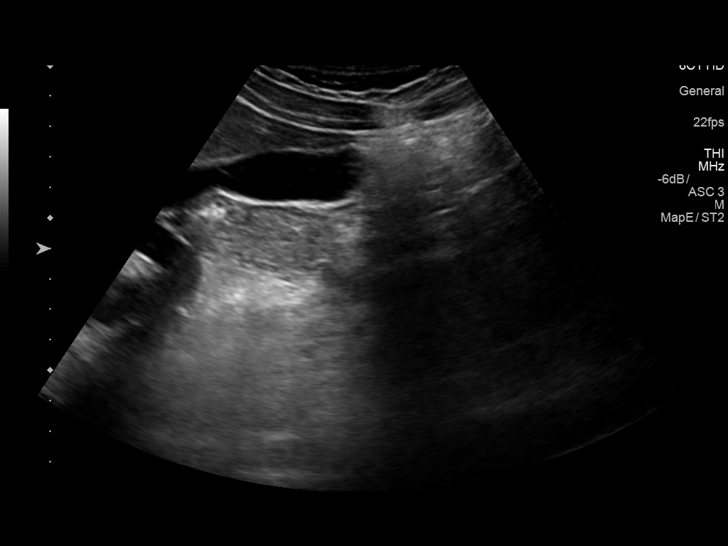
[im 16/95]
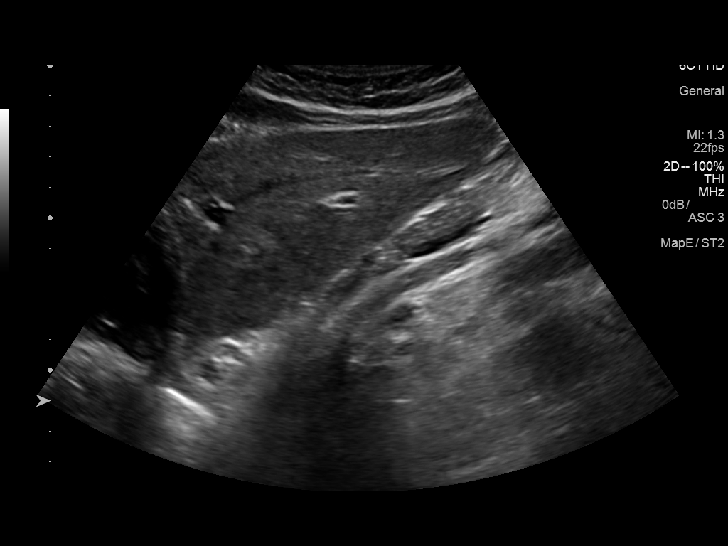
[im 24/95]
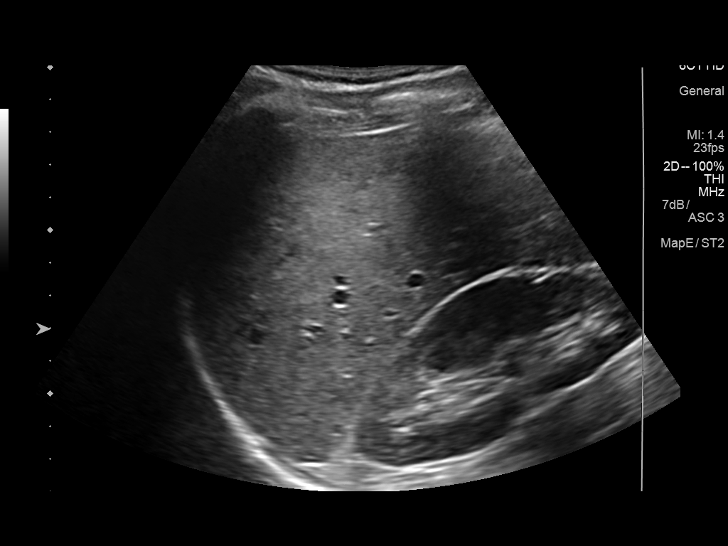
[im 32/95]
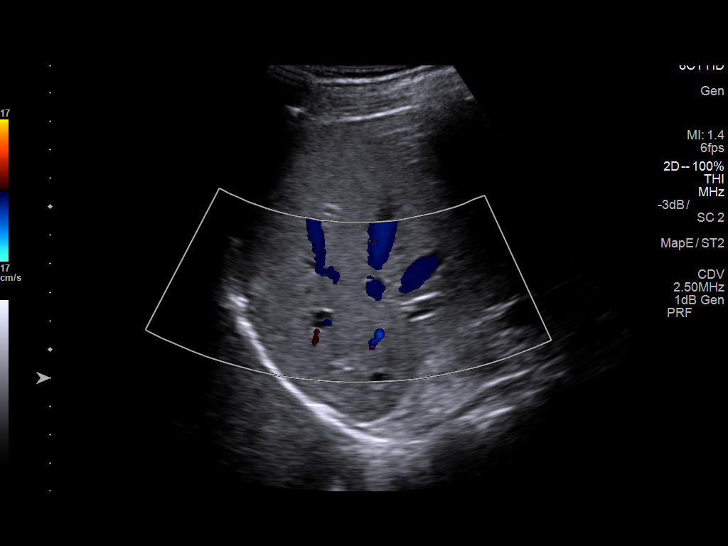
[im 40/95]
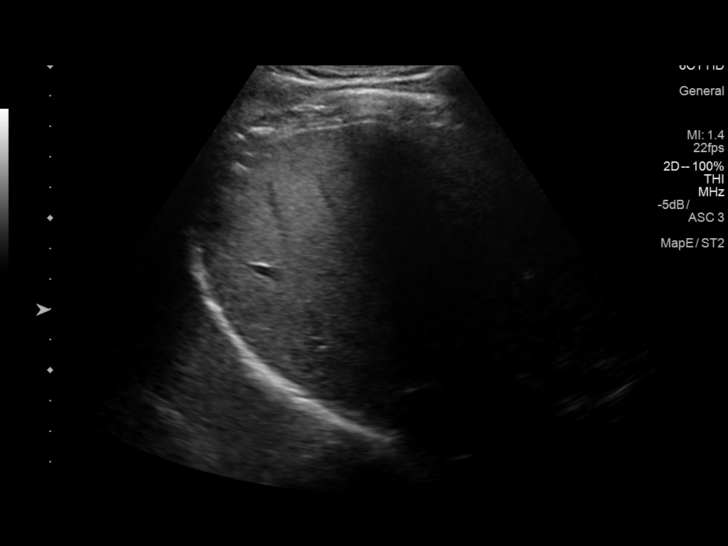
[im 48/95]
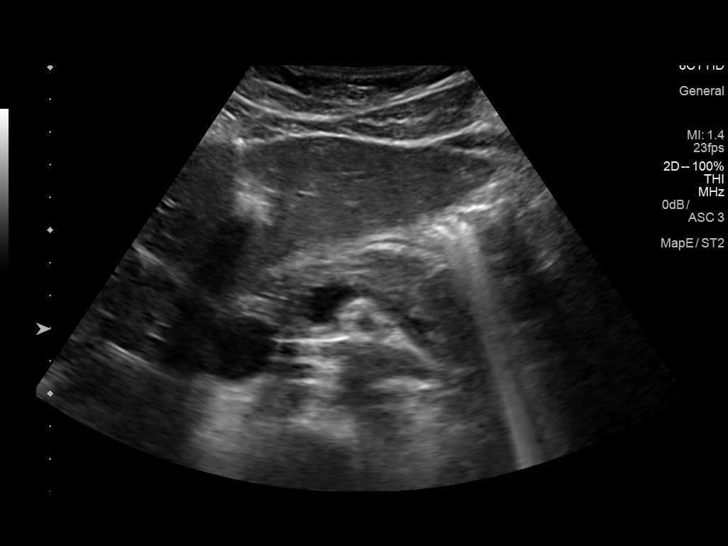
[im 55/95]
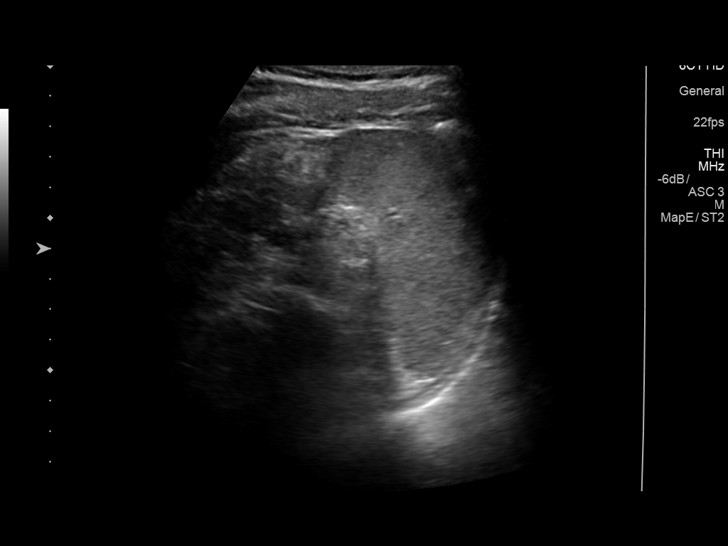
[im 63/95]
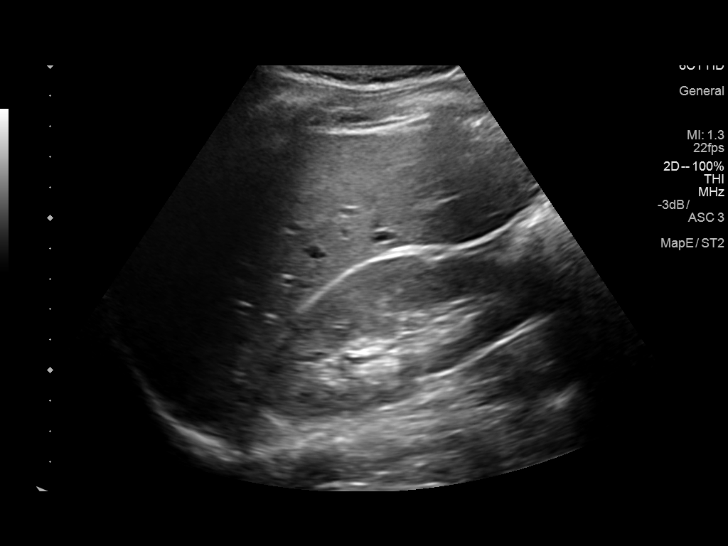
[im 71/95]
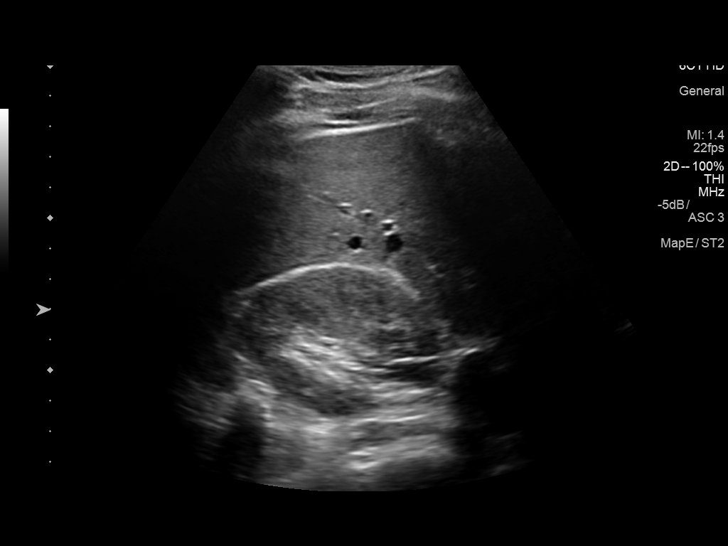
[im 79/95]
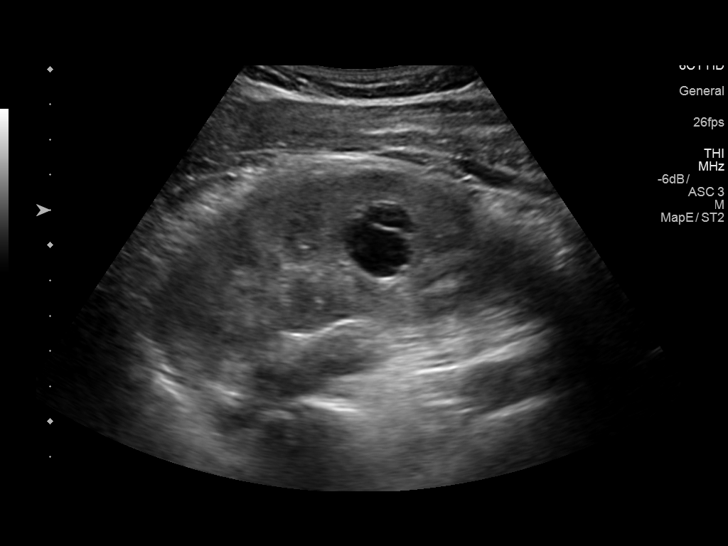
[im 87/95]
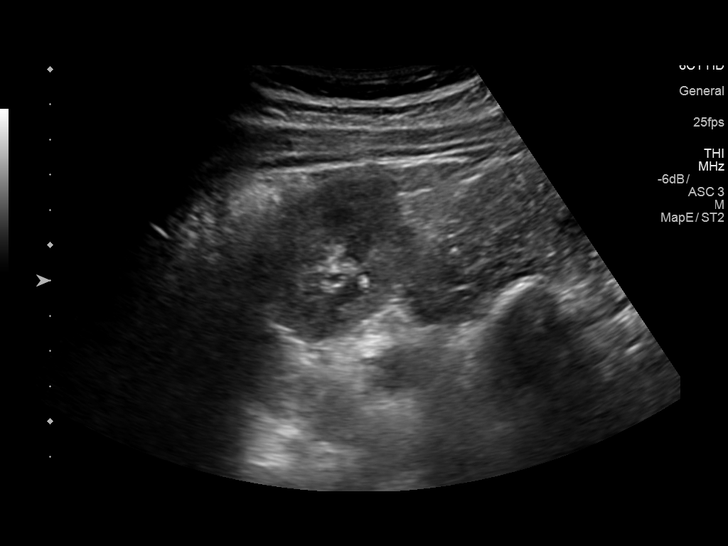
[im 95/95]
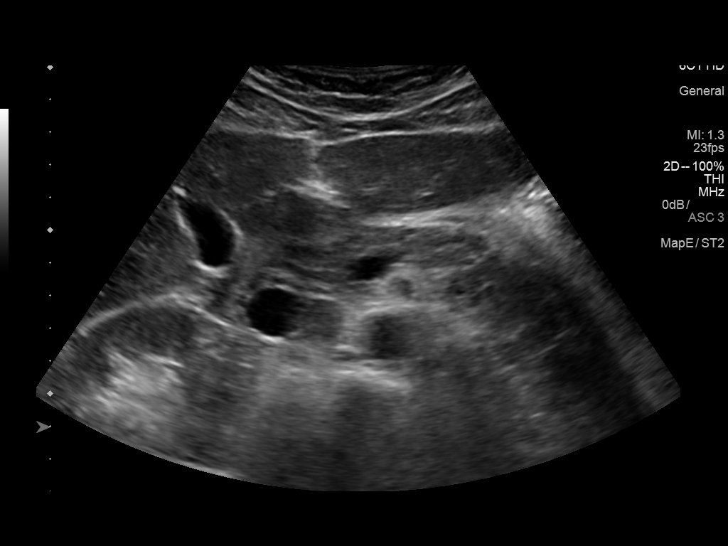

[13 of 25 positions shown; findings below may reference images not displayed]

FINDINGS: Gallbladder: No gallstones or wall thickening visualized. No
sonographic Murphy sign noted by sonographer.

Common bile duct: Diameter: 3 millimeters, normal.

Liver: Background liver echogenicity is within normal limits. Subtle
10-15 millimeter hyperechoic area in the right liver (image 31) is
stable and compatible with a small benign hemangioma. No other
discrete liver lesion. No intrahepatic ductal dilatation. Portal
vein is patent on color Doppler imaging with normal direction of
blood flow towards the liver.

IVC: No abnormality visualized.

Pancreas: Visualized portion unremarkable.

Spleen: Size and appearance within normal limits.

Right Kidney: Length: 10.4 centimeters. Echogenicity within normal
limits. No mass or hydronephrosis visualized.

Left Kidney: Length: 11.1 centimeters. Mid to lower pole
benign-appearing cyst with a single thin septation is stable since
01/13/2018 measuring up to 2.1 centimeters (image 80). Echogenicity
within normal limits. No solid mass or hydronephrosis visualized.

Abdominal aorta: No aneurysm visualized.

Other findings: None.
IMPRESSION: 1. A small right lobe benign liver hemangioma is stable since [REDACTED]
and appears inconsequential.
2. Likewise, a left renal midpole cyst appears benign (Bosniak type
2) and does not require imaging follow up.
3. Otherwise normal abdomen ultrasound.
